# Patient Record
Sex: Female | Born: 1990 | Race: Black or African American | Hispanic: No | Marital: Married | Smoking: Never smoker
Health system: Southern US, Community
[De-identification: ages and names within clinical notes are randomized; demographics above are authoritative.]

## PROBLEM LIST (undated history)

## (undated) DIAGNOSIS — E739 Lactose intolerance, unspecified: Secondary | ICD-10-CM

## (undated) DIAGNOSIS — Z91018 Allergy to other foods: Secondary | ICD-10-CM

## (undated) DIAGNOSIS — J45909 Unspecified asthma, uncomplicated: Secondary | ICD-10-CM

## (undated) DIAGNOSIS — F419 Anxiety disorder, unspecified: Secondary | ICD-10-CM

## (undated) DIAGNOSIS — K829 Disease of gallbladder, unspecified: Secondary | ICD-10-CM

## (undated) DIAGNOSIS — F32A Depression, unspecified: Secondary | ICD-10-CM

## (undated) HISTORY — DX: Unspecified asthma, uncomplicated: J45.909

## (undated) HISTORY — DX: Allergy to other foods: Z91.018

## (undated) HISTORY — DX: Depression, unspecified: F32.A

## (undated) HISTORY — DX: Disease of gallbladder, unspecified: K82.9

## (undated) HISTORY — DX: Lactose intolerance, unspecified: E73.9

## (undated) HISTORY — PX: TUBAL LIGATION: SHX77

## (undated) HISTORY — PX: CHOLECYSTECTOMY: SHX55

## (undated) HISTORY — PX: NASAL SINUS SURGERY: SHX719

## (undated) HISTORY — PX: TONSILLECTOMY: SUR1361

## (undated) HISTORY — DX: Anxiety disorder, unspecified: F41.9

---
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
## 2012-09-20 HISTORY — PX: CHOLECYSTECTOMY: SHX55

## 2015-09-24 ENCOUNTER — Other Ambulatory Visit: Payer: Self-pay | Admitting: Sports Medicine

## 2015-09-24 MED ORDER — ALBUTEROL SULFATE HFA 108 (90 BASE) MCG/ACT IN AERS: 2.0000 | INHALATION_SPRAY | Freq: Four times a day (QID) | RESPIRATORY_TRACT | Status: DC | PRN

## 2016-10-21 HISTORY — PX: TUBAL LIGATION: SHX77

## 2016-11-04 ENCOUNTER — Encounter: Payer: Self-pay | Admitting: Sports Medicine

## 2018-01-06 DIAGNOSIS — Z131 Encounter for screening for diabetes mellitus: Secondary | ICD-10-CM | POA: Diagnosis not present

## 2018-01-06 DIAGNOSIS — E559 Vitamin D deficiency, unspecified: Secondary | ICD-10-CM | POA: Diagnosis not present

## 2018-01-06 DIAGNOSIS — Z113 Encounter for screening for infections with a predominantly sexual mode of transmission: Secondary | ICD-10-CM | POA: Diagnosis not present

## 2018-01-06 DIAGNOSIS — Z130 Encounter for screening for diseases of the blood and blood-forming organs and certain disorders involving the immune mechanism: Secondary | ICD-10-CM | POA: Diagnosis not present

## 2018-01-06 DIAGNOSIS — E049 Nontoxic goiter, unspecified: Secondary | ICD-10-CM | POA: Diagnosis not present

## 2018-01-06 DIAGNOSIS — Z13220 Encounter for screening for lipoid disorders: Secondary | ICD-10-CM | POA: Diagnosis not present

## 2018-04-11 ENCOUNTER — Encounter (HOSPITAL_COMMUNITY): Payer: Self-pay | Admitting: Nurse Practitioner

## 2018-04-11 ENCOUNTER — Emergency Department (HOSPITAL_COMMUNITY): Payer: Medicaid Other

## 2018-04-11 ENCOUNTER — Emergency Department (HOSPITAL_COMMUNITY)
Admission: EM | Admit: 2018-04-11 | Discharge: 2018-04-11 | Disposition: A | Discharge status: Home / Self Care | Payer: Medicaid Other | Attending: Emergency Medicine | Admitting: Emergency Medicine

## 2018-04-11 DIAGNOSIS — R079 Chest pain, unspecified: Secondary | ICD-10-CM | POA: Diagnosis not present

## 2018-04-11 DIAGNOSIS — R000 Tachycardia, unspecified: Secondary | ICD-10-CM | POA: Insufficient documentation

## 2018-04-11 DIAGNOSIS — R531 Weakness: Secondary | ICD-10-CM | POA: Diagnosis not present

## 2018-04-11 DIAGNOSIS — R5383 Other fatigue: Secondary | ICD-10-CM

## 2018-04-11 DIAGNOSIS — Z79899 Other long term (current) drug therapy: Secondary | ICD-10-CM | POA: Diagnosis not present

## 2018-04-11 DIAGNOSIS — R0789 Other chest pain: Secondary | ICD-10-CM | POA: Diagnosis not present

## 2018-04-11 LAB — BASIC METABOLIC PANEL
ANION GAP: 7 (ref 5–15)
BUN: 11 mg/dL (ref 6–20)
CHLORIDE: 103 mmol/L (ref 101–111)
CO2: 30 mmol/L (ref 22–32)
Calcium: 9.4 mg/dL (ref 8.9–10.3)
Creatinine, Ser: 0.93 mg/dL (ref 0.44–1.00)
GFR calc non Af Amer: >60 mL/min (ref >60)
GLUCOSE: 115 mg/dL — AB (ref 65–99)
Potassium: 3.6 mmol/L (ref 3.5–5.1)
Sodium: 140 mmol/L (ref 135–145)

## 2018-04-11 LAB — I-STAT BETA HCG BLOOD, ED (MC, WL, AP ONLY): I-stat hCG, quantitative: <5 m[IU]/mL (ref <5)

## 2018-04-11 LAB — CBC WITH DIFFERENTIAL/PLATELET
BASOS ABS: 0.1 10*3/uL (ref 0.0–0.1)
Basophils Relative: 1 %
Eosinophils Absolute: 0.1 10*3/uL (ref 0.0–0.7)
Eosinophils Relative: 1 %
HEMATOCRIT: 41 % (ref 36.0–46.0)
Hemoglobin: 13.9 g/dL (ref 12.0–15.0)
Lymphocytes Relative: 28 %
Lymphs Abs: 2.9 10*3/uL (ref 0.7–4.0)
MCH: 29.1 pg (ref 26.0–34.0)
MCHC: 33.9 g/dL (ref 30.0–36.0)
MCV: 86 fL (ref 78.0–100.0)
MONO ABS: 0.6 10*3/uL (ref 0.1–1.0)
Monocytes Relative: 6 %
Neutro Abs: 6.6 10*3/uL (ref 1.7–7.7)
Neutrophils Relative %: 64 %
Platelets: 359 10*3/uL (ref 150–400)
RBC: 4.77 MIL/uL (ref 3.87–5.11)
RDW: 13 % (ref 11.5–15.5)
WBC: 10.3 10*3/uL (ref 4.0–10.5)

## 2018-04-11 LAB — I-STAT TROPONIN, ED: Troponin i, poc: 0 ng/mL (ref 0.00–0.08)

## 2018-04-11 LAB — TSH: TSH: 1.782 u[IU]/mL (ref 0.350–4.500)

## 2018-04-11 MED ORDER — IOPAMIDOL (ISOVUE-370) INJECTION 76%
INTRAVENOUS | Status: AC
  Filled 2018-04-11: qty 100

## 2018-04-11 MED ORDER — SODIUM CHLORIDE 0.9 % IV BOLUS
1000.0000 mL | Freq: Once | INTRAVENOUS | Status: AC
  Administered 2018-04-11: 1000 mL via INTRAVENOUS

## 2018-04-11 MED ORDER — IOPAMIDOL (ISOVUE-370) INJECTION 76%
100.0000 mL | Freq: Once | INTRAVENOUS | Status: AC | PRN
  Administered 2018-04-11: 100 mL via INTRAVENOUS

## 2018-04-11 MED ORDER — NAPROXEN 500 MG PO TABS: 500.0000 mg | ORAL_TABLET | Freq: Two times a day (BID) | ORAL | 0 refills | Status: DC

## 2018-04-11 NOTE — ED Triage Notes (Signed)
Pt states since her flight back from [place] about 2 days ago, she has been experiencing generalized weakness and fatigue. Today she started experiencing an right sided achy chest pain that radiates to her right shoulder. She denies shortness of breath, or any cardiopulmonary hx.

## 2018-04-11 NOTE — ED Provider Notes (Signed)
[facility]-EMERGENCY DEPT Provider Note   CSN: [id number] Arrival date & time: 04/11/18  1953     History   Chief Complaint Chief Complaint  Patient presents with  . Chest Pain  . Fatigue    HPI Tamara Weaver is a 27 y.o. female.  Chief complaint is fatigue.  27 year old female.  Previously healthy.  Went to [place] last Tuesday.  Return Friday.  Since returning she has felt fatigued.  No fever.  No chills.  No illness.  Not nauseated or vomiting.  No reasons to be dehydrated.  She has some generalized discomfort.  She states she does have some discomfort in her chest.  She does not feel short of breath.  Does not feel palpitations is not weak to the point of near syncope or being lightheaded.  She denies pregnancy.  HPIsee above.   History reviewed. No pertinent past medical history.  There are no active problems to display for this patient.   Past Surgical History:  Procedure Laterality Date  . CHOLECYSTECTOMY    . TONSILLECTOMY    . TUBAL LIGATION       OB History   None      Home Medications    Prior to Admission medications   Medication Sig Start Date End Date Taking? Authorizing Provider  Multiple Vitamins-Calcium (ONE-A-DAY WOMENS PO) Take 1 tablet by mouth daily.   Yes [provider]  albuterol (PROVENTIL HFA;VENTOLIN HFA) 108 (90 BASE) MCG/ACT inhaler Inhale 2 puffs into the lungs every 6 (six) hours as needed for wheezing or shortness of breath. Patient not taking: Reported on 04/11/2018 09/24/15   Asencion Islam, DPM    Family History No family history on file.  Social History Social History   Tobacco Use  . Smoking status: Never Smoker  . Smokeless tobacco: Never Used  Substance Use Topics  . Alcohol use: Yes  . Drug use: Not Currently     Allergies   Patient has no known allergies.   Review of Systems Review of Systems  Constitutional: Positive for fatigue. Negative for appetite change, chills, diaphoresis  and fever.  HENT: Negative for mouth sores, sore throat and trouble swallowing.   Eyes: Negative for visual disturbance.  Respiratory: Negative for cough, chest tightness, shortness of breath and wheezing.   Cardiovascular: Positive for chest pain.  Gastrointestinal: Negative for abdominal distention, abdominal pain, diarrhea, nausea and vomiting.  Endocrine: Negative for polydipsia, polyphagia and polyuria.  Genitourinary: Negative for dysuria, frequency and hematuria.  Musculoskeletal: Negative for gait problem.  Skin: Negative for color change, pallor and rash.  Neurological: Positive for weakness. Negative for dizziness, syncope, light-headedness and headaches.  Hematological: Does not bruise/bleed easily.  Psychiatric/Behavioral: Negative for behavioral problems and confusion.     Physical Exam Updated Vital Signs BP 120/81   Pulse (!) 106   Temp 98.4 F (36.9 C) (Oral)   Resp 15   Ht 5\' 1"  (1.549 m)   Wt 68 kg (150 lb)   LMP  (LMP Unknown) Comment: currently on period  SpO2 98%   BMI 28.34 kg/m   Physical Exam  Constitutional: She is oriented to person, place, and time. She appears well-developed and well-nourished. No distress.  HENT:  Head: Normocephalic.  Eyes: Pupils are equal, round, and reactive to light. Conjunctivae are normal. No scleral icterus.  Neck: Normal range of motion. Neck supple. No thyromegaly present.  Cardiovascular: Normal rate and regular rhythm. Exam reveals no gallop and no friction rub.  No  murmur heard. Is tachycardia.  117  Pulmonary/Chest: Effort normal and breath sounds normal. No respiratory distress. She has no wheezes. She has no rales.  Abdominal: Soft. Bowel sounds are normal. She exhibits no distension. There is no tenderness. There is no rebound.  Musculoskeletal: Normal range of motion.  Neurological: She is alert and oriented to person, place, and time.  Skin: Skin is warm and dry. No rash noted.  Psychiatric: She has a normal  mood and affect. Her behavior is normal.     ED Treatments / Results  Labs (all labs ordered are listed, but only abnormal results are displayed) Labs Reviewed  BASIC METABOLIC PANEL - Abnormal; Notable for the following components:      Result Value   Glucose, Bld 115 (*)    All other components within normal limits  CBC WITH DIFFERENTIAL/PLATELET  TSH  I-STAT TROPONIN, ED  I-STAT BETA HCG BLOOD, ED ([facility], [facility], [facility] ONLY)    EKG None  Radiology Ct Angio Chest Pe W And/or Wo Contrast  Result Date: 04/11/2018 CLINICAL DATA:  Chest pain right-sided EXAM: CT ANGIOGRAPHY CHEST WITH CONTRAST TECHNIQUE: Multidetector CT imaging of the chest was performed using the standard protocol during bolus administration of intravenous contrast. Multiplanar CT image reconstructions and MIPs were obtained to evaluate the vascular anatomy. CONTRAST:  ISOVUE-370 IOPAMIDOL (ISOVUE-370) INJECTION 76% COMPARISON:  None. FINDINGS: Cardiovascular: Satisfactory opacification of the pulmonary arteries to the segmental level. No evidence of pulmonary embolism. Normal heart size. No pericardial effusion. Nonaneurysmal aorta. No dissection. Mediastinum/Nodes: No enlarged mediastinal, hilar, or axillary lymph nodes. Thyroid gland, trachea, and esophagus demonstrate no significant findings. Lungs/Pleura: Lungs are clear. No pleural effusion or pneumothorax. Upper Abdomen: No acute abnormality. Musculoskeletal: No chest wall abnormality. No acute or significant osseous findings. Review of the MIP images confirms the above findings. IMPRESSION: Negative. No CT evidence for acute pulmonary embolus or aortic dissection. Clear lung fields. Electronically Signed   By: Jasmine Pang M.D.   On: 04/11/2018 22:19   EKG Interpretation  Date/Time:  Saturday April 11 2018 20:17:41 EDT Ventricular Rate:  124 PR Interval:    QRS Duration: 83 QT Interval:  299 QTC Calculation: 430 R Axis:   85 Text Interpretation:  Sinus  tachycardia Borderline Q waves in lateral leads Borderline repolarization abnormality Confirmed by Rolland Porter ([id number]) on 04/11/2018 10:34:14 PM   Procedures Procedures (including critical care time)  Medications Ordered in ED Medications  iopamidol (ISOVUE-370) 76 % injection (has no administration in time range)  sodium chloride 0.9 % bolus 1,000 mL (1,000 mLs Intravenous New Bag/Given 04/11/18 2100)  iopamidol (ISOVUE-370) 76 % injection 100 mL (100 mLs Intravenous Contrast Given 04/11/18 2154)     Initial Impression / Assessment and Plan / ED Course  I have reviewed the triage vital signs and the nursing notes.  Pertinent labs & imaging results that were available during my care of the patient were reviewed by me and considered in my medical decision making (see chart for details).   Heart rate improves with fluids.  Recheck 98.  EKG shows sinus tachycardia.  Does not show acute or ischemic changes.  No injury or ectopy.  Does not have ST elevations that would suggest pericarditis.  She does not have murmur gallop on exam.  Reassuring CT angios.  TSH pending.  Plan is home.  Anti-inflammatories.  Push fluids.  Recheck any worsening.  Primary care follow-up.   Final Clinical Impressions(s) / ED Diagnoses   Final diagnoses:  Fatigue, unspecified type  Tachycardia    ED Discharge Orders    None       Rolland PorterJames, Magdala Brahmbhatt, MD 04/11/18 2238

## 2018-04-11 NOTE — Discharge Instructions (Addendum)
Push fluids to stay hydrated. Return here as needed with any new or worsening symptoms. Recommend primary care follow-up. Naproxen,  anti-inflammatory, twice per day for your chest discomfort.

## 2018-09-18 DIAGNOSIS — B308 Other viral conjunctivitis: Secondary | ICD-10-CM | POA: Diagnosis not present

## 2018-09-22 DIAGNOSIS — J020 Streptococcal pharyngitis: Secondary | ICD-10-CM | POA: Diagnosis not present

## 2018-10-18 DIAGNOSIS — J029 Acute pharyngitis, unspecified: Secondary | ICD-10-CM | POA: Diagnosis not present

## 2018-12-23 DIAGNOSIS — H5213 Myopia, bilateral: Secondary | ICD-10-CM | POA: Diagnosis not present

## 2018-12-24 DIAGNOSIS — H5213 Myopia, bilateral: Secondary | ICD-10-CM | POA: Diagnosis not present

## 2019-01-05 DIAGNOSIS — H5213 Myopia, bilateral: Secondary | ICD-10-CM | POA: Diagnosis not present

## 2019-04-07 ENCOUNTER — Other Ambulatory Visit: Payer: Self-pay | Admitting: Sports Medicine

## 2019-04-07 MED ORDER — FLUCONAZOLE 150 MG PO TABS: 150.0000 mg | ORAL_TABLET | Freq: Once | ORAL | 0 refills | Status: AC

## 2019-04-07 MED ORDER — FLUCONAZOLE 150 MG PO TABS: 150.0000 mg | ORAL_TABLET | Freq: Once | ORAL | 0 refills | Status: DC

## 2019-04-07 MED ORDER — AMOXICILLIN-POT CLAVULANATE 875-125 MG PO TABS: 1.0000 | ORAL_TABLET | Freq: Two times a day (BID) | ORAL | 0 refills | Status: DC

## 2019-04-07 NOTE — Progress Notes (Signed)
Meds sent

## 2019-09-23 DIAGNOSIS — Z20828 Contact with and (suspected) exposure to other viral communicable diseases: Secondary | ICD-10-CM | POA: Diagnosis not present

## 2019-10-28 ENCOUNTER — Other Ambulatory Visit: Payer: Self-pay

## 2019-10-28 ENCOUNTER — Encounter (HOSPITAL_COMMUNITY): Payer: Self-pay

## 2019-10-28 ENCOUNTER — Emergency Department (HOSPITAL_COMMUNITY)
Admission: EM | Admit: 2019-10-28 | Discharge: 2019-10-28 | Disposition: A | Discharge status: Home / Self Care | Payer: Medicaid Other | Attending: Emergency Medicine | Admitting: Emergency Medicine

## 2019-10-28 DIAGNOSIS — I889 Nonspecific lymphadenitis, unspecified: Secondary | ICD-10-CM | POA: Diagnosis not present

## 2019-10-28 DIAGNOSIS — J029 Acute pharyngitis, unspecified: Secondary | ICD-10-CM | POA: Diagnosis present

## 2019-10-28 DIAGNOSIS — R070 Pain in throat: Secondary | ICD-10-CM | POA: Diagnosis not present

## 2019-10-28 DIAGNOSIS — Z79899 Other long term (current) drug therapy: Secondary | ICD-10-CM | POA: Insufficient documentation

## 2019-10-28 MED ORDER — NAPROXEN 500 MG PO TABS: 500.0000 mg | ORAL_TABLET | Freq: Two times a day (BID) | ORAL | 0 refills | Status: DC | PRN

## 2019-10-28 NOTE — ED Notes (Signed)
An After Visit Summary was printed and given to the patient. Discharge instructions given and no further questions at this time.  

## 2019-10-28 NOTE — ED Provider Notes (Signed)
[facility]-EMERGENCY DEPT Provider Note   CSN: [id number] Arrival date & time: 10/28/19  1513     History Chief Complaint  Patient presents with  . Sore Throat    Tamara Weaver is a 29 y.o. female with no significant past medical history presents to the ED with sore throat x10 days with mild swelling and tenderness underneath the left side of her chin.  Patient reports that she went to urgent care today and was prescribed Augmentin given suspicion for bacterial etiology of her sore throat symptoms.  Patient has only been taking Tylenol for her discomfort today, but no other medications or conservative therapies.  She denies any fevers or chills, headache or dizziness, rhinorrhea or congestion, chest pain or difficulty breathing, cough, abdominal discomfort, nausea or vomiting, fatigue, changes in bowel habits, loss of smell or taste, or any other symptoms.  She also denies any trismus, drooling, voice change, wheezing, or difficulty swallowing.  HPI     History reviewed. No pertinent past medical history.  There are no problems to display for this patient.   Past Surgical History:  Procedure Laterality Date  . CHOLECYSTECTOMY    . NASAL SINUS SURGERY    . TONSILLECTOMY    . TUBAL LIGATION       OB History   No obstetric history on file.     Family History  Problem Relation Age of Onset  . Asthma Mother     Social History   Tobacco Use  . Smoking status: Never Smoker  . Smokeless tobacco: Never Used  Substance Use Topics  . Alcohol use: Never  . Drug use: Never    Home Medications Prior to Admission medications   Medication Sig Start Date End Date Taking? Authorizing Provider  albuterol (PROVENTIL HFA;VENTOLIN HFA) 108 (90 BASE) MCG/ACT inhaler Inhale 2 puffs into the lungs every 6 (six) hours as needed for wheezing or shortness of breath. Patient not taking: Reported on 04/11/2018 09/24/15   Asencion Islam, DPM  amoxicillin-clavulanate  (AUGMENTIN) 875-125 MG tablet Take 1 tablet by mouth 2 (two) times daily. 04/07/19   Asencion Islam, DPM  Multiple Vitamins-Calcium (ONE-A-DAY WOMENS PO) Take 1 tablet by mouth daily.    [provider]  naproxen (NAPROSYN) 500 MG tablet Take 1 tablet (500 mg total) by mouth 2 (two) times daily between meals as needed for moderate pain. 10/28/19   Lorelee New, PA-C    Allergies    Patient has no known allergies.  Review of Systems   Review of Systems  Constitutional: Negative for chills and fever.  HENT: Positive for sore throat. Negative for drooling, trouble swallowing and voice change.   Neurological: Negative for headaches.    Physical Exam Updated Vital Signs BP (!) 146/103 (BP Location: Right Arm)   Pulse (!) 117   Temp 98.2 F (36.8 C)   Resp 16   Ht 5\' 1"  (1.549 m)   Wt 77.1 kg   LMP 10/12/2019 (Approximate)   SpO2 100%   BMI 32.12 kg/m   Physical Exam Vitals and nursing note reviewed. Exam conducted with a chaperone present.  Constitutional:      General: She is not in acute distress.    Appearance: Normal appearance. She is not ill-appearing.  HENT:     Head: Normocephalic and atraumatic.     Nose: Nose normal.     Mouth/Throat:     Comments: Patent oropharynx.  No tonsillar hypertrophy or exudates noted.  No stones noted.  Mildly erythematous.  No uvular deviation or masses appreciated.  No trismus.  Tolerating secretions.  No tongue swelling or soft palate induration.  Good dentition throughout. Eyes:     General: No scleral icterus.    Conjunctiva/sclera: Conjunctivae normal.  Neck:     Comments: Full ROM.  Small firm mass appreciated in left submandibular area suspicious for a sublingual/submandibular lymphadenitis.  Mildly tender to palpation.  Clearly delineated with no surrounding swelling. No overlying skin changes.  Cardiovascular:     Rate and Rhythm: Regular rhythm. Tachycardia present.     Pulses: Normal pulses.     Heart sounds: Normal  heart sounds.  Pulmonary:     Effort: Pulmonary effort is normal. No respiratory distress.     Breath sounds: Normal breath sounds. No wheezing.  Skin:    General: Skin is dry.  Neurological:     Mental Status: She is alert.     GCS: GCS eye subscore is 4. GCS verbal subscore is 5. GCS motor subscore is 6.  Psychiatric:        Mood and Affect: Mood normal.        Behavior: Behavior normal.        Thought Content: Thought content normal.      ED Results / Procedures / Treatments   Labs (all labs ordered are listed, but only abnormal results are displayed) Labs Reviewed - No data to display  EKG None  Radiology No results found.  Procedures Procedures (including critical care time)  Medications Ordered in ED Medications - No data to display  ED Course  I have reviewed the triage vital signs and the nursing notes.  Pertinent labs & imaging results that were available during my care of the patient were reviewed by me and considered in my medical decision making (see chart for details).    MDM Rules/Calculators/A&P                      Patient is a young, otherwise healthy female who does not take medications regularly.  While she is tachycardic to 117, she reports that she has been eating and drinking well and "always has elevated heart rate when around doctors".  She reports that she is purely nervous and denies any fevers or chills.  Low suspicion for significant infection.  Instead, her history and physical exam suggest a localized submandibular lymphadenitis vs sialoadenitis.  She denies any trismus, difficulty eating or drinking, feeling as though something gets stuck in her throat, wheezing or respiratory difficulty, drooling, voice change, or other concerning symptoms.  No uvular deviation or soft palate induration.  Do not suspect PTA, RPA, or other soft tissue abscess.  Her sore throat has been manageable with Tylenol, but will prescribe naproxen.  She denies any  chance of pregnancy or history of peptic ulcer disease.  Provided reassurance to patient.  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   Final Clinical Impression(s) / ED Diagnoses Final diagnoses:  Lymphadenitis    Rx / DC Orders ED Discharge Orders         Ordered    naproxen (NAPROSYN) 500 MG tablet  2 times daily between meals PRN     10/28/19 1619           Corena Herter, PA-C 10/28/19 1620    Milton Ferguson, MD 10/28/19 2325

## 2019-10-28 NOTE — Discharge Instructions (Addendum)
Please read the attachment on lymphadenopathy.  Please take the naproxen, as prescribed.  Do not combine with other NSAIDs.  Discontinue if your pregnancy status changes.  I also recommend warm tea with honey or throat lozenges for your sore throat symptoms.  Continue taking your Augmentin, as prescribed.  Return to the ED or seek medical attention for any new or worsening symptoms.

## 2019-10-28 NOTE — ED Triage Notes (Addendum)
Patient c/o sore throat on the left x 1 1/2 weeks. Patient denies any fever or difficulty swallowing or breathing.  Patient went to an UC today and was given a RX for Augmentin and started that today.

## 2019-11-02 DIAGNOSIS — K219 Gastro-esophageal reflux disease without esophagitis: Secondary | ICD-10-CM | POA: Insufficient documentation

## 2019-11-02 DIAGNOSIS — J358 Other chronic diseases of tonsils and adenoids: Secondary | ICD-10-CM | POA: Insufficient documentation

## 2019-11-02 DIAGNOSIS — T17208A Unspecified foreign body in pharynx causing other injury, initial encounter: Secondary | ICD-10-CM | POA: Diagnosis not present

## 2019-11-02 DIAGNOSIS — R070 Pain in throat: Secondary | ICD-10-CM | POA: Diagnosis not present

## 2019-11-08 ENCOUNTER — Other Ambulatory Visit: Payer: Self-pay | Admitting: Sports Medicine

## 2019-11-08 MED ORDER — FLUCONAZOLE 150 MG PO TABS: 150.0000 mg | ORAL_TABLET | Freq: Once | ORAL | 0 refills | Status: AC

## 2019-11-08 NOTE — Progress Notes (Signed)
Diflucan sent to pharmacy.

## 2020-03-06 DIAGNOSIS — Z23 Encounter for immunization: Secondary | ICD-10-CM | POA: Diagnosis not present

## 2020-03-27 ENCOUNTER — Emergency Department (HOSPITAL_COMMUNITY)
Admission: EM | Admit: 2020-03-27 | Discharge: 2020-03-27 | Discharge status: Against Medical Advice | Payer: Medicaid Other | Attending: Emergency Medicine | Admitting: Emergency Medicine

## 2020-03-27 ENCOUNTER — Other Ambulatory Visit: Payer: Self-pay

## 2020-03-27 ENCOUNTER — Encounter (HOSPITAL_COMMUNITY): Payer: Self-pay

## 2020-03-27 ENCOUNTER — Emergency Department (HOSPITAL_COMMUNITY): Payer: Medicaid Other

## 2020-03-27 DIAGNOSIS — Z79899 Other long term (current) drug therapy: Secondary | ICD-10-CM | POA: Insufficient documentation

## 2020-03-27 DIAGNOSIS — R0602 Shortness of breath: Secondary | ICD-10-CM | POA: Diagnosis not present

## 2020-03-27 DIAGNOSIS — R0789 Other chest pain: Secondary | ICD-10-CM | POA: Insufficient documentation

## 2020-03-27 DIAGNOSIS — R079 Chest pain, unspecified: Secondary | ICD-10-CM

## 2020-03-27 DIAGNOSIS — J984 Other disorders of lung: Secondary | ICD-10-CM | POA: Diagnosis not present

## 2020-03-27 MED ORDER — SODIUM CHLORIDE 0.9% FLUSH: 3.0000 mL | Freq: Once | INTRAVENOUS | Status: DC

## 2020-03-27 NOTE — ED Notes (Signed)
Called pt for rooming No answer x2

## 2020-03-27 NOTE — Discharge Instructions (Addendum)
You have chosen to sign out AMA.  I cannot guarantee there is not a cardiac emergency without blood work. I do recommend that you at least follow-up with your primary care doctor.

## 2020-03-27 NOTE — ED Triage Notes (Signed)
Patient c/o intermittent mid chest pain that radiates into the left shoulder since yesterday. Patient denies any SOB or nausea.

## 2020-03-27 NOTE — ED Notes (Signed)
Pt states she needs [place] IV for labs

## 2020-03-27 NOTE — ED Provider Notes (Signed)
[facility]-EMERGENCY DEPT Provider Note   CSN: [id number] Arrival date & time: 03/27/20  1816     History Chief Complaint  Patient presents with  . Chest Pain    Tamara Weaver is a 29 y.o. female.  The history is provided by the patient and medical records.  Chest Pain  29 year old female with no significant past medical history presenting to the ED with chest pain.  States this began yesterday, localized to right upper chest and into her right shoulder.  States it was intermittent yesterday but has been fairly constant today.  She denies any current shortness of breath.  States she did have a panic attack yesterday morning when symptoms began that caused her to be a little short of breath but symptoms have resolved.  She has not had any cough or fever.  No prior cardiac history.  She is not a smoker.  Does not take OCPs or other exogenous estrogen use.  No recent travel or prolonged immobilization.  No recent surgeries.  She has not tried any medications for her symptoms prior to arrival.  History reviewed. No pertinent past medical history.  There are no problems to display for this patient.   Past Surgical History:  Procedure Laterality Date  . CHOLECYSTECTOMY    . NASAL SINUS SURGERY    . TUBAL LIGATION       OB History   No obstetric history on file.     Family History  Problem Relation Age of Onset  . Asthma Mother     Social History   Tobacco Use  . Smoking status: Never Smoker  . Smokeless tobacco: Never Used  Substance Use Topics  . Alcohol use: Never  . Drug use: Never    Home Medications Prior to Admission medications   Medication Sig Start Date End Date Taking? Authorizing Provider  albuterol (PROVENTIL HFA;VENTOLIN HFA) 108 (90 BASE) MCG/ACT inhaler Inhale 2 puffs into the lungs every 6 (six) hours as needed for wheezing or shortness of breath. Patient not taking: Reported on 04/11/2018 09/24/15   Asencion Islam, DPM    amoxicillin-clavulanate (AUGMENTIN) 875-125 MG tablet Take 1 tablet by mouth 2 (two) times daily. 04/07/19   Asencion Islam, DPM  Multiple Vitamins-Calcium (ONE-A-DAY WOMENS PO) Take 1 tablet by mouth daily.    [provider]  naproxen (NAPROSYN) 500 MG tablet Take 1 tablet (500 mg total) by mouth 2 (two) times daily between meals as needed for moderate pain. 10/28/19   Lorelee New, PA-C    Allergies    Other  Review of Systems   Review of Systems  Cardiovascular: Positive for chest pain.  All other systems reviewed and are negative.   Physical Exam Updated Vital Signs BP (!) 129/100 (BP Location: Right Arm)   Pulse 90   Temp 99.3 F (37.4 C) (Oral)   Resp 18   Ht 5\' 1"  (1.549 m)   Wt 74.8 kg   LMP 03/04/2020   SpO2 100%   BMI 31.18 kg/m   Physical Exam Vitals and nursing note reviewed.  Constitutional:      Appearance: She is well-developed.  HENT:     Head: Normocephalic and atraumatic.  Eyes:     Conjunctiva/sclera: Conjunctivae normal.     Pupils: Pupils are equal, round, and reactive to light.  Cardiovascular:     Rate and Rhythm: Normal rate and regular rhythm.     Heart sounds: Normal heart sounds.     Comments: NSR, HR  stable at 87 during exam Pulmonary:     Effort: Pulmonary effort is normal.     Breath sounds: Normal breath sounds. No wheezing or rhonchi.  Chest:     Comments: Chest wall is nontender Abdominal:     General: Bowel sounds are normal.     Palpations: Abdomen is soft.  Musculoskeletal:        General: Normal range of motion.     Cervical back: Normal range of motion.  Skin:    General: Skin is warm and dry.  Neurological:     Mental Status: She is alert and oriented to person, place, and time.     ED Results / Procedures / Treatments   Labs (all labs ordered are listed, but only abnormal results are displayed) Labs Reviewed  BASIC METABOLIC PANEL  CBC  I-STAT BETA HCG BLOOD, ED ([facility], [facility], [facility] ONLY)  TROPONIN I (HIGH  SENSITIVITY)  TROPONIN I (HIGH SENSITIVITY)    EKG None  Radiology DG Chest 2 View  Result Date: 03/27/2020 CLINICAL DATA:  Chest pain, short of breath EXAM: CHEST - 2 VIEW COMPARISON:  None. FINDINGS: The heart size and mediastinal contours are within normal limits. Both lungs are clear. The visualized skeletal structures are unremarkable. IMPRESSION: No active cardiopulmonary disease. Electronically Signed   By: Randa Ngo M.D.   On: 03/27/2020 19:28    Procedures Procedures (including critical care time)  Medications Ordered in ED Medications  sodium chloride flush (NS) 0.9 % injection 3 mL (has no administration in time range)    ED Course  I have reviewed the triage vital signs and the nursing notes.  Pertinent labs & imaging results that were available during my care of the patient were reviewed by me and considered in my medical decision making (see chart for details).    MDM Rules/Calculators/A&P  29 year old female presents to the ED with right-sided chest pain.  Symptoms began yesterday, initially intermittent but more constant today.  No cough, fever, or other infectious symptoms.  She is afebrile nontoxic in appearance here.  Chest is nontender.  Her lungs are clear without any noted wheezes or rhonchi.  Had a initial tachycardia on arrival of 103, however returned to the 80s by time of my evaluation without intervention.  Denies any pleuritic type pain with deep breathing, OCP use, exogenous estrogen, or long distance travel.  No history of DVT or PE.  No cardiac history.  She is not a smoker.  Work-up is pending.  11:18 PM Notified by RN that patient wanted to leave AMA.  She has had CXR and EKG but no labs.  She is aware that we cannot guarantee there is not a cardiac condition or other emergency without further work-up.  This may results in end organ damage, disability, or death.  Patient acknowledges this and will sign out AMA.  Final Clinical Impression(s) / ED  Diagnoses Final diagnoses:  Chest pain in adult    Rx / DC Orders ED Discharge Orders    None       Larene Pickett, PA-C 03/27/20 2347    Drenda Freeze, MD 03/27/20 [phone]

## 2020-04-27 ENCOUNTER — Ambulatory Visit: Payer: Medicaid Other | Admitting: Nurse Practitioner

## 2020-05-24 ENCOUNTER — Ambulatory Visit: Payer: Medicaid Other | Admitting: Nurse Practitioner

## 2020-05-30 ENCOUNTER — Other Ambulatory Visit: Payer: Self-pay | Admitting: Sports Medicine

## 2020-05-30 MED ORDER — AZITHROMYCIN 250 MG PO TABS: ORAL_TABLET | ORAL | 0 refills | Status: DC

## 2020-05-30 NOTE — Progress Notes (Signed)
Sent Zpak

## 2020-05-31 DIAGNOSIS — Z20822 Contact with and (suspected) exposure to covid-19: Secondary | ICD-10-CM | POA: Diagnosis not present

## 2020-06-07 ENCOUNTER — Other Ambulatory Visit: Payer: Self-pay

## 2020-06-08 ENCOUNTER — Encounter: Payer: Self-pay | Admitting: Nurse Practitioner

## 2020-06-08 ENCOUNTER — Ambulatory Visit (INDEPENDENT_AMBULATORY_CARE_PROVIDER_SITE_OTHER): Payer: Medicaid Other | Admitting: Nurse Practitioner

## 2020-06-08 VITALS — BP 130/86 | HR 94 | Temp 97.5°F | Ht 61.0 in | Wt 177.8 lb

## 2020-06-08 DIAGNOSIS — Z13220 Encounter for screening for lipoid disorders: Secondary | ICD-10-CM

## 2020-06-08 DIAGNOSIS — E6609 Other obesity due to excess calories: Secondary | ICD-10-CM | POA: Diagnosis not present

## 2020-06-08 DIAGNOSIS — E669 Obesity, unspecified: Secondary | ICD-10-CM | POA: Insufficient documentation

## 2020-06-08 DIAGNOSIS — Z6833 Body mass index (BMI) 33.0-33.9, adult: Secondary | ICD-10-CM | POA: Diagnosis not present

## 2020-06-08 DIAGNOSIS — Z136 Encounter for screening for cardiovascular disorders: Secondary | ICD-10-CM | POA: Diagnosis not present

## 2020-06-08 DIAGNOSIS — F411 Generalized anxiety disorder: Secondary | ICD-10-CM

## 2020-06-08 LAB — BASIC METABOLIC PANEL
BUN: 11 mg/dL (ref 6–23)
CO2: 26 mEq/L (ref 19–32)
Calcium: 9.4 mg/dL (ref 8.4–10.5)
Chloride: 103 mEq/L (ref 96–112)
Creatinine, Ser: 0.86 mg/dL (ref 0.40–1.20)
GFR: 95.26 mL/min (ref >60.00)
Glucose, Bld: 93 mg/dL (ref 70–99)
Potassium: 4.3 mEq/L (ref 3.5–5.1)
Sodium: 136 mEq/L (ref 135–145)

## 2020-06-08 LAB — LIPID PANEL
Cholesterol: 219 mg/dL — ABNORMAL HIGH (ref 0–200)
HDL: 53.2 mg/dL (ref >39.00)
LDL Cholesterol: 146 mg/dL — ABNORMAL HIGH (ref 0–99)
NonHDL: 166.04
Total CHOL/HDL Ratio: 4
Triglycerides: 101 mg/dL (ref 0.0–149.0)
VLDL: 20.2 mg/dL (ref 0.0–40.0)

## 2020-06-08 LAB — HEMOGLOBIN A1C: Hgb A1c MFr Bld: 5.6 % (ref 4.6–6.5)

## 2020-06-08 LAB — TSH: TSH: 1.27 u[IU]/mL (ref 0.35–4.50)

## 2020-06-08 MED ORDER — DULOXETINE HCL 20 MG PO CPEP: 20.0000 mg | ORAL_CAPSULE | Freq: Every day | ORAL | 2 refills | Status: DC

## 2020-06-08 NOTE — Assessment & Plan Note (Signed)
Difficulty with weight loss despite decreased portion, heart health diet, and exercise 3x/week. Admits with emotional eating. Check BMP and TSH Consider referral to weight loss clinic

## 2020-06-08 NOTE — Assessment & Plan Note (Signed)
Chronic, waxing and waning Preoccupation with death, but no SI No hx of hospitalization, no ETOH use, no tobacco use, no illicit drug use. No Hx of suicide PHQ-9 13/27 and GAD-7 13/21  Check TSH, CBC, BMP Start cymbalta Entered referral to therapist Avoid SSRI due to potential weight gain as side effect F/up in 30month

## 2020-06-08 NOTE — Patient Instructions (Addendum)
Thank you for choosing [facility] Go to lab for blood draw. Start cymbalta. Take with supper. You will be contacted to schedule appt with therapist.   Duloxetine delayed-release capsules What is this medicine? DULOXETINE (doo LOX e teen) is used to treat depression, anxiety, and different types of chronic pain. This medicine may be used for other purposes; ask your health care provider or pharmacist if you have questions. COMMON BRAND NAME(S): Cymbalta, Murrell Converse, Irenka What should I tell my health care provider before I take this medicine? They need to know if you have any of these conditions:  bipolar disorder  glaucoma  high blood pressure  kidney disease  liver disease  seizures  suicidal thoughts, plans or attempt; a previous suicide attempt by you or a family member  take medicines that treat or prevent blood clots  taken medicines called MAOIs like Carbex, Eldepryl, Marplan, Nardil, and Parnate within 14 days  trouble passing urine  an unusual reaction to duloxetine, other medicines, foods, dyes, or preservatives  pregnant or trying to get pregnant  breast-feeding How should I use this medicine? Take this medicine by mouth with a glass of water. Follow the directions on the prescription label. Do not crush, cut or chew some capsules of this medicine. Some capsules may be opened and sprinkled on applesauce. Check with your doctor or pharmacist if you are not sure. You can take this medicine with or without food. Take your medicine at regular intervals. Do not take your medicine more often than directed. Do not stop taking this medicine suddenly except upon the advice of your doctor. Stopping this medicine too quickly may cause serious side effects or your condition may worsen. A special MedGuide will be given to you by the pharmacist with each prescription and refill. Be sure to read this information carefully each time. Talk to your pediatrician regarding  the use of this medicine in children. While this drug may be prescribed for children as young as 51 years of age for selected conditions, precautions do apply. Overdosage: If you think you have taken too much of this medicine contact a poison control center or emergency room at once. NOTE: This medicine is only for you. Do not share this medicine with others. What if I miss a dose? If you miss a dose, take it as soon as you can. If it is almost time for your next dose, take only that dose. Do not take double or extra doses. What may interact with this medicine? Do not take this medicine with any of the following medications:  desvenlafaxine  levomilnacipran  linezolid  MAOIs like Carbex, Eldepryl, Marplan, Nardil, and Parnate  methylene blue (injected into a vein)  milnacipran  thioridazine  venlafaxine This medicine may also interact with the following medications:  alcohol  amphetamines  aspirin and aspirin-like medicines  certain antibiotics like ciprofloxacin and enoxacin  certain medicines for blood pressure, heart disease, irregular heart beat  certain medicines for depression, anxiety, or psychotic disturbances  certain medicines for migraine headache like almotriptan, eletriptan, frovatriptan, naratriptan, rizatriptan, sumatriptan, zolmitriptan  certain medicines that treat or prevent blood clots like warfarin, enoxaparin, and dalteparin  cimetidine  fentanyl  lithium  NSAIDS, medicines for pain and inflammation, like ibuprofen or naproxen  phentermine  procarbazine  rasagiline  sibutramine  St. John's wort  theophylline  tramadol  tryptophan This list may not describe all possible interactions. Give your health care provider a list of all the medicines, herbs, non-prescription drugs, or  dietary supplements you use. Also tell them if you smoke, drink alcohol, or use illegal drugs. Some items may interact with your medicine. What should I watch  for while using this medicine? Tell your doctor if your symptoms do not get better or if they get worse. Visit your doctor or healthcare provider for regular checks on your progress. Because it may take several weeks to see the full effects of this medicine, it is important to continue your treatment as prescribed by your doctor. This medicine may cause serious skin reactions. They can happen weeks to months after starting the medicine. Contact your healthcare provider right away if you notice fevers or flu-like symptoms with a rash. The rash may be red or purple and then turn into blisters or peeling of the skin. Or, you might notice a red rash with swelling of the face, lips, or lymph nodes in your neck or under your arms. Patients and their families should watch out for new or worsening thoughts of suicide or depression. Also watch out for sudden changes in feelings such as feeling anxious, agitated, panicky, irritable, hostile, aggressive, impulsive, severely restless, overly excited and hyperactive, or not being able to sleep. If this happens, especially at the beginning of treatment or after a change in dose, call your healthcare provider. You may get drowsy or dizzy. Do not drive, use machinery, or do anything that needs mental alertness until you know how this medicine affects you. Do not stand or sit up quickly, especially if you are an older patient. This reduces the risk of dizzy or fainting spells. Alcohol may interfere with the effect of this medicine. Avoid alcoholic drinks. This medicine can cause an increase in blood pressure. This medicine can also cause a sudden drop in your blood pressure, which may make you feel faint and increase the chance of a fall. These effects are most common when you first start the medicine or when the dose is increased, or during use of other medicines that can cause a sudden drop in blood pressure. Check with your doctor for instructions on monitoring your blood  pressure while taking this medicine. Your mouth may get dry. Chewing sugarless gum or sucking hard candy, and drinking plenty of water, may help. Contact your doctor if the problem does not go away or is severe. What side effects may I notice from receiving this medicine? Side effects that you should report to your doctor or health care professional as soon as possible:  allergic reactions like skin rash, itching or hives, swelling of the face, lips, or tongue  anxious  breathing problems  confusion  changes in vision  chest pain  confusion  elevated mood, decreased need for sleep, racing thoughts, impulsive behavior  eye pain  fast, irregular heartbeat  feeling faint or lightheaded, falls  feeling agitated, angry, or irritable  hallucination, loss of contact with reality  high blood pressure  loss of balance or coordination  palpitations  redness, blistering, peeling or loosening of the skin, including inside the mouth  restlessness, pacing, inability to keep still  seizures  stiff muscles  suicidal thoughts or other mood changes  trouble passing urine or change in the amount of urine  trouble sleeping  unusual bleeding or bruising  unusually weak or tired  vomiting  yellowing of the eyes or skin Side effects that usually do not require medical attention (report to your doctor or health care professional if they continue or are bothersome):  change in sex drive  or performance  change in appetite or weight  constipation  dizziness  dry mouth  headache  increased sweating  nausea  tired This list may not describe all possible side effects. Call your doctor for medical advice about side effects. You may report side effects to FDA at 1-800-FDA-1088. Where should I keep my medicine? Keep out of the reach of children. Store at room temperature between 15 and 30 degrees C (59 to 86 degrees F). Throw away any unused medicine after the  expiration date. NOTE: This sheet is a summary. It may not cover all possible information. If you have questions about this medicine, talk to your doctor, pharmacist, or health care provider.  2020 Elsevier/Gold Standard (2019-01-07 13:47:50)

## 2020-06-08 NOTE — Progress Notes (Signed)
Subjective:  Patient ID: Tamara Weaver, female    DOB: 1991-08-30  Age: 29 y.o. MRN: [id number]  CC: Establish Care (New patient physical, would like to discuss anxiety and weight gain)  HPI  GAD (generalized anxiety disorder) Chronic, waxing and waning Preoccupation with death, but no SI No hx of hospitalization, no ETOH use, no tobacco use, no illicit drug use. No Hx of suicide PHQ-9 13/27 and GAD-7 13/21  Check TSH, CBC, BMP Start cymbalta Entered referral to therapist Avoid SSRI due to potential weight gain as side effect F/up in 75month  Class 1 obesity due to excess calories with serious comorbidity and body mass index (BMI) of 33.0 to 33.9 in adult Difficulty with weight loss despite decreased portion, heart health diet, and exercise 3x/week. Admits with emotional eating. Check BMP and TSH Consider referral to weight loss clinic  S/p tubal ligation Reviewed past Medical, Social and Family history today.  Outpatient Medications Prior to Visit  Medication Sig Dispense Refill  . Ascorbic Acid (VITAMIN C) 1000 MG tablet Take 1,000 mg by mouth daily.    . Multiple Vitamin (MULTI-VITAMIN) tablet Take 1 tablet by mouth daily.    . Multiple Vitamins-Minerals (VITAMIN D3 COMPLETE) TABS Take by mouth.    Marland Kitchen albuterol (PROVENTIL HFA;VENTOLIN HFA) 108 (90 BASE) MCG/ACT inhaler Inhale 2 puffs into the lungs every 6 (six) hours as needed for wheezing or shortness of breath. (Patient not taking: Reported on 04/11/2018) 1 Inhaler 1  . amoxicillin-clavulanate (AUGMENTIN) 875-125 MG tablet Take 1 tablet by mouth 2 (two) times daily. 28 tablet 0  . azithromycin (ZITHROMAX) 250 MG tablet Take as directed 6 tablet 0  . Multiple Vitamins-Calcium (ONE-A-DAY WOMENS PO) Take 1 tablet by mouth daily.    . naproxen (NAPROSYN) 500 MG tablet Take 1 tablet (500 mg total) by mouth 2 (two) times daily between meals as needed for moderate pain. 30 tablet 0   No facility-administered medications prior to  visit.    ROS See HPI  Objective:  BP 130/86 (BP Location: Left Arm, Patient Position: Sitting, Cuff Size: Normal)   Pulse 94   Temp (!) 97.5 F (36.4 C) (Temporal)   Ht 5\' 1"  (1.549 m)   Wt 177 lb 12.8 oz (80.6 kg)   SpO2 99%   BMI 33.60 kg/m   Physical Exam Neck:     Thyroid: No thyroid mass, thyromegaly or thyroid tenderness.  Cardiovascular:     Rate and Rhythm: Normal rate and regular rhythm.     Pulses: Normal pulses.     Heart sounds: Normal heart sounds.  Pulmonary:     Effort: Pulmonary effort is normal.     Breath sounds: Normal breath sounds.  Musculoskeletal:     Cervical back: Normal range of motion and neck supple.  Lymphadenopathy:     Cervical: No cervical adenopathy.  Neurological:     Mental Status: She is oriented to person, place, and time.  Psychiatric:        Mood and Affect: Mood is anxious. Affect is tearful.        Speech: Speech normal.        Behavior: Behavior is cooperative.        Thought Content: Thought content is not paranoid or delusional. Thought content does not include homicidal or suicidal ideation. Thought content does not include homicidal or suicidal plan.        Cognition and Memory: Cognition normal.        Judgment: Judgment normal.  Assessment & Plan:  This visit occurred during the SARS-CoV-2 public health emergency.  Safety protocols were in place, including screening questions prior to the visit, additional usage of staff PPE, and extensive cleaning of exam room while observing appropriate contact time as indicated for disinfecting solutions.   Aleigh was seen today for establish care.  Diagnoses and all orders for this visit:  GAD (generalized anxiety disorder) -     DULoxetine (CYMBALTA) 20 MG capsule; Take 1 capsule (20 mg total) by mouth daily. -     TSH -     Ambulatory referral to Psychology  Class 1 obesity due to excess calories with serious comorbidity and body mass index (BMI) of 33.0 to 33.9 in adult -      Basic metabolic panel -     Lipid panel -     TSH -     Hemoglobin A1c  Encounter for lipid screening for cardiovascular disease -     Lipid panel    Problem List Items Addressed This Visit      Other   Class 1 obesity due to excess calories with serious comorbidity and body mass index (BMI) of 33.0 to 33.9 in adult    Difficulty with weight loss despite decreased portion, heart health diet, and exercise 3x/week. Admits with emotional eating. Check BMP and TSH Consider referral to weight loss clinic      Relevant Orders   Basic metabolic panel   Lipid panel   TSH   Hemoglobin A1c   GAD (generalized anxiety disorder) - Primary    Chronic, waxing and waning Preoccupation with death, but no SI No hx of hospitalization, no ETOH use, no tobacco use, no illicit drug use. No Hx of suicide PHQ-9 13/27 and GAD-7 13/21  Check TSH, CBC, BMP Start cymbalta Entered referral to therapist Avoid SSRI due to potential weight gain as side effect F/up in 1month      Relevant Medications   DULoxetine (CYMBALTA) 20 MG capsule   Other Relevant Orders   TSH   Ambulatory referral to Psychology    Other Visit Diagnoses    Encounter for lipid screening for cardiovascular disease       Relevant Orders   Lipid panel      Follow-up: Return in about 4 weeks (around 07/06/2020) for anxiety and weight management ( ).  Alysia Penna, NP

## 2020-06-14 ENCOUNTER — Telehealth: Payer: Self-pay | Admitting: Nurse Practitioner

## 2020-06-14 NOTE — Telephone Encounter (Signed)
Patient is calling and wanted to speak to someone regarding her labs, please advise, CB is [phone].

## 2020-06-14 NOTE — Telephone Encounter (Signed)
Pt has been contacted and labs have been discussed.

## 2020-06-16 ENCOUNTER — Telehealth: Payer: Self-pay | Admitting: Nurse Practitioner

## 2020-06-16 DIAGNOSIS — Z6833 Body mass index (BMI) 33.0-33.9, adult: Secondary | ICD-10-CM

## 2020-06-16 NOTE — Telephone Encounter (Signed)
Referral entered  

## 2020-06-16 NOTE — Telephone Encounter (Signed)
-----   Message from Tamara Weaver, [place] sent at 06/14/2020  1:12 PM EDT ----- Patient notified and verbalized understanding Pt states she would like the referral to a weight loss clinic.

## 2020-06-20 DIAGNOSIS — Z20822 Contact with and (suspected) exposure to covid-19: Secondary | ICD-10-CM | POA: Diagnosis not present

## 2020-07-06 ENCOUNTER — Encounter (INDEPENDENT_AMBULATORY_CARE_PROVIDER_SITE_OTHER): Payer: Self-pay | Admitting: Family Medicine

## 2020-07-06 ENCOUNTER — Ambulatory Visit (INDEPENDENT_AMBULATORY_CARE_PROVIDER_SITE_OTHER): Payer: Medicaid Other | Admitting: Family Medicine

## 2020-07-06 ENCOUNTER — Other Ambulatory Visit: Payer: Self-pay

## 2020-07-06 VITALS — BP 111/71 | HR 90 | Temp 98.3°F | Ht 61.0 in | Wt 171.0 lb

## 2020-07-06 DIAGNOSIS — R5383 Other fatigue: Secondary | ICD-10-CM | POA: Diagnosis not present

## 2020-07-06 DIAGNOSIS — Z6832 Body mass index (BMI) 32.0-32.9, adult: Secondary | ICD-10-CM

## 2020-07-06 DIAGNOSIS — E669 Obesity, unspecified: Secondary | ICD-10-CM | POA: Diagnosis not present

## 2020-07-06 DIAGNOSIS — Z0289 Encounter for other administrative examinations: Secondary | ICD-10-CM

## 2020-07-06 DIAGNOSIS — Z1331 Encounter for screening for depression: Secondary | ICD-10-CM | POA: Diagnosis not present

## 2020-07-06 DIAGNOSIS — E782 Mixed hyperlipidemia: Secondary | ICD-10-CM

## 2020-07-06 DIAGNOSIS — R0602 Shortness of breath: Secondary | ICD-10-CM

## 2020-07-06 DIAGNOSIS — R739 Hyperglycemia, unspecified: Secondary | ICD-10-CM | POA: Diagnosis not present

## 2020-07-06 DIAGNOSIS — Z9189 Other specified personal risk factors, not elsewhere classified: Secondary | ICD-10-CM

## 2020-07-06 DIAGNOSIS — E559 Vitamin D deficiency, unspecified: Secondary | ICD-10-CM | POA: Diagnosis not present

## 2020-07-07 LAB — LIPID PANEL
Chol/HDL Ratio: 3.8 ratio (ref 0.0–4.4)
Cholesterol, Total: 226 mg/dL — ABNORMAL HIGH (ref 100–199)
HDL: 59 mg/dL (ref >39)
LDL Chol Calc (NIH): 154 mg/dL — ABNORMAL HIGH (ref 0–99)
Triglycerides: 75 mg/dL (ref 0–149)
VLDL Cholesterol Cal: 13 mg/dL (ref 5–40)

## 2020-07-07 LAB — COMPREHENSIVE METABOLIC PANEL
ALT: 33 IU/L — ABNORMAL HIGH (ref 0–32)
AST: 19 IU/L (ref 0–40)
Albumin/Globulin Ratio: 1.8 (ref 1.2–2.2)
Albumin: 4.8 g/dL (ref 3.9–5.0)
Alkaline Phosphatase: 118 IU/L (ref 44–121)
BUN/Creatinine Ratio: 9 (ref 9–23)
BUN: 7 mg/dL (ref 6–20)
Bilirubin Total: 0.2 mg/dL (ref 0.0–1.2)
CO2: 24 mmol/L (ref 20–29)
Calcium: 9.7 mg/dL (ref 8.7–10.2)
Chloride: 101 mmol/L (ref 96–106)
Creatinine, Ser: 0.79 mg/dL (ref 0.57–1.00)
GFR calc Af Amer: 119 mL/min/{1.73_m2} (ref >59)
GFR calc non Af Amer: 103 mL/min/{1.73_m2} (ref >59)
Globulin, Total: 2.7 g/dL (ref 1.5–4.5)
Glucose: 88 mg/dL (ref 65–99)
Potassium: 4.4 mmol/L (ref 3.5–5.2)
Sodium: 139 mmol/L (ref 134–144)
Total Protein: 7.5 g/dL (ref 6.0–8.5)

## 2020-07-07 LAB — HEMOGLOBIN A1C
Est. average glucose Bld gHb Est-mCnc: 117 mg/dL
Hgb A1c MFr Bld: 5.7 % — ABNORMAL HIGH (ref 4.8–5.6)

## 2020-07-07 LAB — CBC WITH DIFFERENTIAL/PLATELET
Basophils Absolute: 0.1 10*3/uL (ref 0.0–0.2)
Basos: 1 %
EOS (ABSOLUTE): 0.1 10*3/uL (ref 0.0–0.4)
Eos: 1 %
Hematocrit: 41.4 % (ref 34.0–46.6)
Hemoglobin: 14.1 g/dL (ref 11.1–15.9)
Immature Grans (Abs): 0 10*3/uL (ref 0.0–0.1)
Immature Granulocytes: 0 %
Lymphocytes Absolute: 2.8 10*3/uL (ref 0.7–3.1)
Lymphs: 32 %
MCH: 29.3 pg (ref 26.6–33.0)
MCHC: 34.1 g/dL (ref 31.5–35.7)
MCV: 86 fL (ref 79–97)
Monocytes Absolute: 0.5 10*3/uL (ref 0.1–0.9)
Monocytes: 6 %
Neutrophils Absolute: 5.3 10*3/uL (ref 1.4–7.0)
Neutrophils: 60 %
Platelets: 355 10*3/uL (ref 150–450)
RBC: 4.82 x10E6/uL (ref 3.77–5.28)
RDW: 12.6 % (ref 11.7–15.4)
WBC: 8.7 10*3/uL (ref 3.4–10.8)

## 2020-07-07 LAB — VITAMIN B12: Vitamin B-12: 1095 pg/mL (ref 232–1245)

## 2020-07-07 LAB — INSULIN, RANDOM: INSULIN: 27.7 u[IU]/mL — ABNORMAL HIGH (ref 2.6–24.9)

## 2020-07-07 LAB — FOLATE: Folate: 15 ng/mL (ref >3.0)

## 2020-07-07 LAB — T4: T4, Total: 9.3 ug/dL (ref 4.5–12.0)

## 2020-07-07 LAB — VITAMIN D 25 HYDROXY (VIT D DEFICIENCY, FRACTURES): Vit D, 25-Hydroxy: 37.6 ng/mL (ref 30.0–100.0)

## 2020-07-07 LAB — T3: T3, Total: 124 ng/dL (ref 71–180)

## 2020-07-07 LAB — TSH: TSH: 1.21 u[IU]/mL (ref 0.450–4.500)

## 2020-07-13 NOTE — Progress Notes (Signed)
Chief Complaint:   OBESITY Tamara Weaver (MR# [id number]) is a 29 y.o. female who presents for evaluation and treatment of obesity and related comorbidities. Current BMI is Body mass index is 32.31 kg/m. Tamara Weaver has been struggling with her weight for many years and has been unsuccessful in either losing weight, maintaining weight loss, or reaching her healthy weight goal.  Tamara Weaver is currently in the action stage of change and ready to dedicate time achieving and maintaining a healthier weight. Tamara Weaver is interested in becoming our patient and working on intensive lifestyle modifications including (but not limited to) diet and exercise for weight loss.  Tamara Weaver habits were reviewed today and are as follows: Her family eats meals together, her desired weight loss is 26-36 lbs, she started gaining weight in college and after her last pregnancy, her heaviest weight ever was 173 pounds, she has significant food cravings issues, she skips meals frequently, she is frequently drinking liquids with calories, she has problems with excessive hunger, she frequently eats larger portions than normal and she struggles with emotional eating.  Depression Screen Tamara Weaver Food and Mood (modified PHQ-9) score was 12.  Depression screen PHQ 2/9 07/06/2020  Decreased Interest 0  Down, Depressed, Hopeless 2  PHQ - 2 Score 2  Altered sleeping 2  Tired, decreased energy 1  Change in appetite 2  Feeling bad or failure about yourself  2  Trouble concentrating 3  Moving slowly or fidgety/restless 0  Suicidal thoughts 0  PHQ-9 Score 12  Difficult doing work/chores Not difficult at all   Subjective:   1. Other fatigue Tamara Weaver admits to daytime somnolence and admits to waking up still tired. Patent has a history of symptoms of daytime fatigue. Tamara Weaver generally gets 4 or 5 hours of sleep per night, and states that she has nightime awakenings. Snoring is present. Apneic episodes are not present. Epworth  Sleepiness Score is 11.  2. Shortness of breath on exertion Tamara Weaver notes increasing shortness of breath with exercising and seems to be worsening over time with weight gain. She notes getting out of breath sooner with activity than she used to. This has not gotten worse recently. Tamara Weaver denies shortness of breath at rest or orthopnea.  3. Mixed hyperlipidemia Tamara Weaver has a histoey of elevated LDL, and she denies chest pain. She would like to improve with diet.  4. Hyperglycemia Tamara Weaver has a history of elevated glucose levels, and she notes polyphagia.  5. Vitamin D deficiency Tamara Weaver is on Vit D and she notes fatigue. She has no recent labs.  6. At risk for heart disease Tamara Weaver is at a higher than average risk for cardiovascular disease due to obesity.   Assessment/Plan:   1. Other fatigue Tamara Weaver does feel that her weight is causing her energy to be lower than it should be. Fatigue may be related to obesity, depression or many other causes. Labs will be ordered, and in the meanwhile, Tamara Weaver will focus on self care including making healthy food choices, increasing physical activity and focusing on stress reduction.  - EKG 12-Lead - Vitamin B12 - CBC with Differential/Platelet - Folate - T3 - T4 - TSH  2. Shortness of breath on exertion Tamara Weaver does feel that she gets out of breath more easily that she used to when she exercises. Tamara Weaver shortness of breath appears to be obesity related and exercise induced. She has agreed to work on weight loss and gradually increase exercise to treat her exercise induced shortness of breath. Will  continue to monitor closely.  3. Mixed hyperlipidemia Cardiovascular risk and specific lipid/LDL goals reviewed. We discussed several lifestyle modifications today. We will check labs today. Tamara Weaver will start her Category 2 plan, and will continue to work on exercise and weight loss efforts. Orders and follow up as documented in patient record.   - Lipid  panel  4. Hyperglycemia Fasting labs will be obtained today, and results with be discussed with Tamara Weaver in 2 weeks at her follow up visit. In the meanwhile Tamara Weaver will start her Category 2 plan and will work on weight loss efforts.  - Comprehensive metabolic panel - Hemoglobin A1c - Insulin, random  5. Vitamin D deficiency Low Vitamin D level contributes to fatigue and are associated with obesity, breast, and colon cancer. We will check labs today, and Tamara Weaver will follow-up for routine testing of Vitamin D, at least 2-3 times per year to avoid over-replacement.  - VITAMIN D 25 Hydroxy (Vit-D Deficiency, Fractures)  6. Depression screening Tamara Weaver had a positive depression screening. Depression is commonly associated with obesity and often results in emotional eating behaviors. We will monitor this closely and work on CBT to help improve the non-hunger eating patterns. Referral to Psychology may be required if no improvement is seen as she continues in our clinic.  7. At risk for heart disease Tamara Weaver was given approximately 15 minutes of coronary artery disease prevention counseling today. She is 29 y.o. female and has risk factors for heart disease including obesity. We discussed intensive lifestyle modifications today with an emphasis on specific weight loss instructions and strategies.   Repetitive spaced learning was employed today to elicit superior memory formation and behavioral change.  8. Class 1 obesity with serious comorbidity and body mass index (BMI) of 32.0 to 32.9 in adult, unspecified obesity type Tamara Weaver is currently in the action stage of change and her goal is to continue with weight loss efforts. I recommend Tamara Weaver begin the structured treatment plan as follows:  She has agreed to the Category 2 Plan + 100 calories.  Exercise goals: No exercise has been prescribed for now, while we concentrate on nutritional changes.  Behavioral modification strategies: increasing lean  protein intake and no skipping meals.  She was informed of the importance of frequent follow-up visits to maximize her success with intensive lifestyle modifications for her multiple health conditions. She was informed we would discuss her lab results at her next visit unless there is a critical issue that needs to be addressed sooner. Tamara Weaver agreed to keep her next visit at the agreed upon time to discuss these results.  Objective:   Blood pressure 111/71, pulse 90, temperature 98.3 F (36.8 C), height 5\' 1"  (1.549 m), weight 171 lb (77.6 kg), last menstrual period 06/12/2020, SpO2 100 %. Body mass index is 32.31 kg/m.  EKG: Normal sinus rhythm, rate 80 BPM.  Indirect Calorimeter completed today shows a VO2 of 273 and a REE of 1906.  Her calculated basal metabolic rate is 06/14/2020 thus her basal metabolic rate is better than expected.  General: Cooperative, alert, well developed, in no acute distress. HEENT: Conjunctivae and lids unremarkable. Cardiovascular: Regular rhythm.  Lungs: Normal work of breathing. Neurologic: No focal deficits.   Lab Results  Component Value Date   CREATININE 0.79 07/06/2020   BUN 7 07/06/2020   NA 139 07/06/2020   K 4.4 07/06/2020   CL 101 07/06/2020   CO2 24 07/06/2020   Lab Results  Component Value Date   ALT 33 (H)  07/06/2020   AST 19 07/06/2020   ALKPHOS 118 07/06/2020   BILITOT 0.2 07/06/2020   Lab Results  Component Value Date   HGBA1C 5.7 (H) 07/06/2020   HGBA1C 5.6 06/08/2020   Lab Results  Component Value Date   INSULIN 27.7 (H) 07/06/2020   Lab Results  Component Value Date   TSH 1.210 07/06/2020   Lab Results  Component Value Date   CHOL 226 (H) 07/06/2020   HDL 59 07/06/2020   LDLCALC 154 (H) 07/06/2020   TRIG 75 07/06/2020   CHOLHDL 3.8 07/06/2020   Lab Results  Component Value Date   WBC 8.7 07/06/2020   HGB 14.1 07/06/2020   HCT 41.4 07/06/2020   MCV 86 07/06/2020   PLT 355 07/06/2020   No results found for:  IRON, TIBC, FERRITIN  Attestation Statements:   Reviewed by clinician on day of visit: allergies, medications, problem list, medical history, surgical history, family history, social history, and previous encounter notes.   I, Burt Knack, am acting as transcriptionist for Quillian Quince, MD.  I have reviewed the above documentation for accuracy and completeness, and I agree with the above. - Quillian Quince, MD

## 2020-07-14 ENCOUNTER — Ambulatory Visit: Payer: Medicaid Other | Admitting: Nurse Practitioner

## 2020-07-20 ENCOUNTER — Other Ambulatory Visit: Payer: Self-pay

## 2020-07-20 ENCOUNTER — Ambulatory Visit (INDEPENDENT_AMBULATORY_CARE_PROVIDER_SITE_OTHER): Payer: Medicaid Other | Admitting: Family Medicine

## 2020-07-20 VITALS — BP 119/73 | HR 89 | Temp 98.4°F | Ht 61.0 in | Wt 169.0 lb

## 2020-07-20 DIAGNOSIS — Z6830 Body mass index (BMI) 30.0-30.9, adult: Secondary | ICD-10-CM | POA: Diagnosis not present

## 2020-07-20 DIAGNOSIS — E559 Vitamin D deficiency, unspecified: Secondary | ICD-10-CM

## 2020-07-20 DIAGNOSIS — E669 Obesity, unspecified: Secondary | ICD-10-CM | POA: Diagnosis not present

## 2020-07-20 DIAGNOSIS — Z9189 Other specified personal risk factors, not elsewhere classified: Secondary | ICD-10-CM | POA: Diagnosis not present

## 2020-07-20 DIAGNOSIS — R7303 Prediabetes: Secondary | ICD-10-CM | POA: Diagnosis not present

## 2020-07-20 DIAGNOSIS — E7849 Other hyperlipidemia: Secondary | ICD-10-CM | POA: Diagnosis not present

## 2020-07-20 DIAGNOSIS — R7401 Elevation of levels of liver transaminase levels: Secondary | ICD-10-CM | POA: Insufficient documentation

## 2020-07-20 MED ORDER — METFORMIN HCL 500 MG PO TABS: 500.0000 mg | ORAL_TABLET | Freq: Every morning | ORAL | 0 refills | Status: DC

## 2020-07-20 MED ORDER — VITAMIN D (ERGOCALCIFEROL) 1.25 MG (50000 UNIT) PO CAPS: 50000.0000 [IU] | ORAL_CAPSULE | ORAL | 0 refills | Status: DC

## 2020-07-24 NOTE — Progress Notes (Signed)
Chief Complaint:   OBESITY Tamara Weaver is here to discuss her progress with her obesity treatment plan along with follow-up of her obesity related diagnoses. Tamara Weaver is on the Category 2 Plan + 100 calories and states she is following her eating plan approximately 50% of the time. Tamara Weaver states she is doing 0 minutes 0 times per week.  Today's visit was #: 2 Starting weight: 171 lbs Starting date: 07/06/2020 Today's weight: 169 lbs Today's date: 07/20/2020 Total lbs lost to date: 2 Total lbs lost since last in-office visit: 2  Interim History: Tamara Weaver struggled to get on a routine with her eating plan. She sometimes skipped meals and struggled to eat all of her food at times.  Subjective:   1. Elevated ALT measurement Tamara Weaver's ALT is mildly elevated. She denies abdominal pain or jaundice. I discussed labs with the patient today.  2. Other hyperlipidemia, pure Tamara Weaver's LDL is elevated, but HDL and triglycerides are within normal limits. I discussed labs with the patient today.  3. Vitamin D deficiency Tamara Weaver's Vit D level is not at goal, and she notes fatigue. I discussed labs with the patient today.  4. Pre-diabetes Tamara Weaver's A1c and insulin are lelevated. She notes a family history of diabetes mellitus, and she notes polyphagia. I discussed labs with the patient today.  5. At risk for diabetes mellitus Tamara Weaver is at higher than average risk for developing diabetes due to her obesity.   Assessment/Plan:   1. 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 We will recheck labs in 3 months.  2. Other hyperlipidemia, pure Cardiovascular risk and specific lipid/LDL goals reviewed. We discussed several lifestyle modifications today. Tamara Weaver will continue to work on diet, exercise and weight loss efforts. We will recheck labs in 3 months. Orders and follow up as documented in patient record.   3. Vitamin D  deficiency Low Vitamin D level contributes to fatigue and are associated with obesity, breast, and colon cancer. Tamara Weaver agreed to start prescription Vitamin D 50,000 IU every week #4 with no refills. She will follow-up for routine testing of Vitamin D, at least 2-3 times per year to avoid over-replacement. We will recheck labs in 3 months.  - Vitamin D, Ergocalciferol, (DRISDOL) 1.25 MG (50000 UNIT) CAPS capsule; Take 1 capsule (50,000 Units total) by mouth every 7 (seven) days.  Dispense: 4 capsule; Refill: 0  4. Pre-diabetes Tamara Weaver will continue to work on weight loss, diet, exercise, and decreasing simple carbohydrates to help decrease the risk of diabetes. Tamara Weaver agreed to start metformin 500 mg q AM #30 with no refills.  - metFORMIN (GLUCOPHAGE) 500 MG tablet; Take 1 tablet (500 mg total) by mouth in the morning.  Dispense: 30 tablet; Refill: 0  5. At risk for diabetes mellitus Tamara Weaver was given approximately 30 minutes of diabetes education and counseling today. We discussed intensive lifestyle modifications today with an emphasis on weight loss as well as increasing exercise and decreasing simple carbohydrates in her diet. We also reviewed medication options with an emphasis on risk versus benefit of those discussed.   Repetitive spaced learning was employed today to elicit superior memory formation and behavioral change.  6. Class 1 obesity with serious comorbidity and body mass index (BMI) of 30.0 to 30.9 in adult, unspecified obesity type Tamara Weaver is currently in the action stage of change. As such, her goal is to continue with weight loss efforts. She has agreed to the Category  2 Plan + 100 calories.   Behavioral modification strategies: increasing lean protein intake and no skipping meals.  Tamara Weaver has agreed to follow-up with our clinic in 2 weeks. She was informed of the importance of frequent follow-up visits to maximize her success with intensive lifestyle modifications for her  multiple health conditions.   Objective:   Blood pressure 119/73, pulse 89, temperature 98.4 F (36.9 C), height 5\' 1"  (1.549 m), weight 169 lb (76.7 kg), SpO2 98 %. Body mass index is 31.93 kg/m.  General: Cooperative, alert, well developed, in no acute distress. HEENT: Conjunctivae and lids unremarkable. Cardiovascular: Regular rhythm.  Lungs: Normal work of breathing. Neurologic: No focal deficits.   Lab Results  Component Value Date   CREATININE 0.79 07/06/2020   BUN 7 07/06/2020   NA 139 07/06/2020   K 4.4 07/06/2020   CL 101 07/06/2020   CO2 24 07/06/2020   Lab Results  Component Value Date   ALT 33 (H) 07/06/2020   AST 19 07/06/2020   ALKPHOS 118 07/06/2020   BILITOT 0.2 07/06/2020   Lab Results  Component Value Date   HGBA1C 5.7 (H) 07/06/2020   HGBA1C 5.6 06/08/2020   Lab Results  Component Value Date   INSULIN 27.7 (H) 07/06/2020   Lab Results  Component Value Date   TSH 1.210 07/06/2020   Lab Results  Component Value Date   CHOL 226 (H) 07/06/2020   HDL 59 07/06/2020   LDLCALC 154 (H) 07/06/2020   TRIG 75 07/06/2020   CHOLHDL 3.8 07/06/2020   Lab Results  Component Value Date   WBC 8.7 07/06/2020   HGB 14.1 07/06/2020   HCT 41.4 07/06/2020   MCV 86 07/06/2020   PLT 355 07/06/2020   No results found for: IRON, TIBC, FERRITIN  Attestation Statements:   Reviewed by clinician on day of visit: allergies, medications, problem list, medical history, surgical history, family history, social history, and previous encounter notes.   I, 07/08/2020, am acting as transcriptionist for Burt Knack, MD.  I have reviewed the above documentation for accuracy and completeness, and I agree with the above. -  Quillian Quince, MD

## 2020-07-25 DIAGNOSIS — J029 Acute pharyngitis, unspecified: Secondary | ICD-10-CM | POA: Diagnosis not present

## 2020-07-25 DIAGNOSIS — Z20822 Contact with and (suspected) exposure to covid-19: Secondary | ICD-10-CM | POA: Diagnosis not present

## 2020-07-28 DIAGNOSIS — J029 Acute pharyngitis, unspecified: Secondary | ICD-10-CM | POA: Diagnosis not present

## 2020-07-28 DIAGNOSIS — Z20822 Contact with and (suspected) exposure to covid-19: Secondary | ICD-10-CM | POA: Diagnosis not present

## 2020-08-01 ENCOUNTER — Ambulatory Visit (INDEPENDENT_AMBULATORY_CARE_PROVIDER_SITE_OTHER): Payer: Medicaid Other | Admitting: Clinical

## 2020-08-01 DIAGNOSIS — F419 Anxiety disorder, unspecified: Secondary | ICD-10-CM | POA: Diagnosis not present

## 2020-08-02 ENCOUNTER — Other Ambulatory Visit: Payer: Self-pay

## 2020-08-02 DIAGNOSIS — F411 Generalized anxiety disorder: Secondary | ICD-10-CM

## 2020-08-02 NOTE — Telephone Encounter (Signed)
Insurance is suggesting refilling patient Rx for 90 day supply instead of 30 day. Please advise.

## 2020-08-03 ENCOUNTER — Ambulatory Visit (INDEPENDENT_AMBULATORY_CARE_PROVIDER_SITE_OTHER): Payer: Medicaid Other | Admitting: Physician Assistant

## 2020-08-03 ENCOUNTER — Encounter (INDEPENDENT_AMBULATORY_CARE_PROVIDER_SITE_OTHER): Payer: Self-pay | Admitting: Physician Assistant

## 2020-08-03 ENCOUNTER — Other Ambulatory Visit: Payer: Self-pay

## 2020-08-03 VITALS — BP 109/74 | HR 90 | Temp 98.4°F | Ht 61.0 in | Wt 171.0 lb

## 2020-08-03 DIAGNOSIS — E559 Vitamin D deficiency, unspecified: Secondary | ICD-10-CM

## 2020-08-03 DIAGNOSIS — E669 Obesity, unspecified: Secondary | ICD-10-CM | POA: Diagnosis not present

## 2020-08-03 DIAGNOSIS — E7849 Other hyperlipidemia: Secondary | ICD-10-CM

## 2020-08-03 DIAGNOSIS — Z6832 Body mass index (BMI) 32.0-32.9, adult: Secondary | ICD-10-CM

## 2020-08-03 NOTE — Telephone Encounter (Signed)
No change in quantity at this time. She needs to schedule f/up with me

## 2020-08-07 MED ORDER — VITAMIN D (ERGOCALCIFEROL) 1.25 MG (50000 UNIT) PO CAPS: 50000.0000 [IU] | ORAL_CAPSULE | ORAL | 0 refills | Status: DC

## 2020-08-07 NOTE — Progress Notes (Signed)
Chief Complaint:   OBESITY Tamara Weaver is here to discuss her progress with her obesity treatment plan along with follow-up of her obesity related diagnoses. Tamara Weaver is on the Category 2 Plan + 100 calories and states she is following her eating plan approximately 60-70% of the time. Tamara Weaver states she is exercising 0 minutes 0 times per week.  Today's visit was #: 3 Starting weight: 171 lbs Starting date: 07/06/2020 Today's weight: 171 lbs Today's date: 08/03/2020 Total lbs lost to date: 0 Total lbs lost since last in-office visit: 0  Interim History: Tamara Weaver states that the metformin made her feel bloated the first few days. She reports missing meals on some days. 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  Subjective:   Vitamin D deficiency. Tamara Weaver is on prescription Vitamin D weekly, which she is tolerating well.   Ref. Range 07/06/2020 11:16  Vitamin D, 25-Hydroxy Latest Ref Range: 30.0 - 100.0 ng/mL 37.6   Other hyperlipidemia. Tamara Weaver has hyperlipidemia and has been trying to improve her cholesterol levels with intensive lifestyle modification including a low saturated fat diet, exercise and weight loss. She denies any chest pain, claudication or myalgias. Tamara Weaver is on no medication. LDL and total cholesterol levels were elevated on last labs.  Lab Results  Component Value Date   ALT 33 (H) 07/06/2020   AST 19 07/06/2020   ALKPHOS 118 07/06/2020   BILITOT 0.2 07/06/2020   Lab Results  Component Value Date   CHOL 226 (H) 07/06/2020   HDL 59 07/06/2020   LDLCALC 154 (H) 07/06/2020   TRIG 75 07/06/2020   CHOLHDL 3.8 07/06/2020   Assessment/Plan:   Vitamin D deficiency. Low Vitamin D level contributes to fatigue and are associated with obesity, breast, and colon cancer. She was given a refill on her Vitamin D, Ergocalciferol, (DRISDOL) 1.25 MG (50000 UNIT) CAPS capsule every week #4 with 0 refills and will follow-up for routine testing of Vitamin D, at least  2-3 times per year to avoid over-replacement.   Other hyperlipidemia. Cardiovascular risk and specific lipid/LDL goals reviewed.  We discussed several lifestyle modifications today and Tamara Weaver will continue to work on diet, exercise and weight loss efforts. Orders and follow up as documented in patient record.   Counseling Intensive lifestyle modifications are the first line treatment for this issue. . Dietary changes: Increase soluble fiber. Decrease simple carbohydrates. . Exercise changes: Moderate to vigorous-intensity aerobic activity 150 minutes per week if tolerated. . Lipid-lowering medications: see documented in medical record.  Class 1 obesity with serious comorbidity and body mass index (BMI) of 32.0 to 32.9 in adult, unspecified obesity type.  Tamara Weaver is currently in the action stage of change. As such, her goal is to continue with weight loss efforts. She has agreed to the Category 2 Plan + 100 calories.   Exercise goals: For substantial health benefits, adults should do at least 150 minutes (2 hours and 30 minutes) a week of moderate-intensity, or 75 minutes (1 hour and 15 minutes) a week of vigorous-intensity aerobic physical activity, or an equivalent combination of moderate- and vigorous-intensity aerobic activity. Aerobic activity should be performed in episodes of at least 10 minutes, and preferably, it should be spread throughout the week.  Behavioral modification strategies: meal planning and cooking strategies and keeping healthy foods in the home.  Tamara Weaver has agreed to follow-up with our clinic in 2 weeks. She was informed of the importance of frequent follow-up visits to maximize her success  with intensive lifestyle modifications for her multiple health conditions.   Objective:   Blood pressure 109/74, pulse 90, temperature 98.4 F (36.9 C), height 5\' 1"  (1.549 m), weight 171 lb (77.6 kg), SpO2 98 %. Body mass index is 32.31 kg/m.  General: Cooperative, alert, well  developed, in no acute distress. HEENT: Conjunctivae and lids unremarkable. Cardiovascular: Regular rhythm.  Lungs: Normal work of breathing. Neurologic: No focal deficits.   Lab Results  Component Value Date   CREATININE 0.79 07/06/2020   BUN 7 07/06/2020   NA 139 07/06/2020   K 4.4 07/06/2020   CL 101 07/06/2020   CO2 24 07/06/2020   Lab Results  Component Value Date   ALT 33 (H) 07/06/2020   AST 19 07/06/2020   ALKPHOS 118 07/06/2020   BILITOT 0.2 07/06/2020   Lab Results  Component Value Date   HGBA1C 5.7 (H) 07/06/2020   HGBA1C 5.6 06/08/2020   Lab Results  Component Value Date   INSULIN 27.7 (H) 07/06/2020   Lab Results  Component Value Date   TSH 1.210 07/06/2020   Lab Results  Component Value Date   CHOL 226 (H) 07/06/2020   HDL 59 07/06/2020   LDLCALC 154 (H) 07/06/2020   TRIG 75 07/06/2020   CHOLHDL 3.8 07/06/2020   Lab Results  Component Value Date   WBC 8.7 07/06/2020   HGB 14.1 07/06/2020   HCT 41.4 07/06/2020   MCV 86 07/06/2020   PLT 355 07/06/2020   No results found for: IRON, TIBC, FERRITIN  Attestation Statements:   Reviewed by clinician on day of visit: allergies, medications, problem list, medical history, surgical history, family history, social history, and previous encounter notes.  I09/18/2021, am acting as transcriptionist for Marianna Payment, PA-C   I have reviewed the above documentation for accuracy and completeness, and I agree with the above. Alois Cliche, PA-C

## 2020-08-07 NOTE — Telephone Encounter (Signed)
LVM for patient to return call. 

## 2020-08-11 ENCOUNTER — Other Ambulatory Visit (INDEPENDENT_AMBULATORY_CARE_PROVIDER_SITE_OTHER): Payer: Self-pay | Admitting: Family Medicine

## 2020-08-11 DIAGNOSIS — E559 Vitamin D deficiency, unspecified: Secondary | ICD-10-CM

## 2020-08-11 DIAGNOSIS — R7303 Prediabetes: Secondary | ICD-10-CM

## 2020-08-16 ENCOUNTER — Ambulatory Visit (INDEPENDENT_AMBULATORY_CARE_PROVIDER_SITE_OTHER): Payer: Medicaid Other | Admitting: Clinical

## 2020-08-16 DIAGNOSIS — F419 Anxiety disorder, unspecified: Secondary | ICD-10-CM

## 2020-08-17 ENCOUNTER — Other Ambulatory Visit: Payer: Self-pay

## 2020-08-17 ENCOUNTER — Encounter (INDEPENDENT_AMBULATORY_CARE_PROVIDER_SITE_OTHER): Payer: Self-pay | Admitting: Physician Assistant

## 2020-08-17 ENCOUNTER — Ambulatory Visit (INDEPENDENT_AMBULATORY_CARE_PROVIDER_SITE_OTHER): Payer: Medicaid Other | Admitting: Physician Assistant

## 2020-08-17 VITALS — BP 115/76 | HR 83 | Temp 98.3°F | Ht 61.0 in | Wt 173.0 lb

## 2020-08-17 DIAGNOSIS — E669 Obesity, unspecified: Secondary | ICD-10-CM | POA: Diagnosis not present

## 2020-08-17 DIAGNOSIS — R7303 Prediabetes: Secondary | ICD-10-CM | POA: Diagnosis not present

## 2020-08-17 DIAGNOSIS — E559 Vitamin D deficiency, unspecified: Secondary | ICD-10-CM

## 2020-08-17 DIAGNOSIS — Z6832 Body mass index (BMI) 32.0-32.9, adult: Secondary | ICD-10-CM | POA: Diagnosis not present

## 2020-08-17 MED ORDER — METFORMIN HCL 500 MG PO TABS: 500.0000 mg | ORAL_TABLET | Freq: Every day | ORAL | 0 refills | Status: DC

## 2020-08-17 NOTE — Progress Notes (Signed)
Chief Complaint:   OBESITY Tamara Weaver is here to discuss her progress with her obesity treatment plan along with follow-up of her obesity related diagnoses. Tamara Weaver is on the Category 2 Plan + 100 calories and states she is following her eating plan approximately 45% of the time. Tamara Weaver states she is exercising 0 minutes 0 times per week.  Today's visit was #: 4 Starting weight: 171 lbs Starting date: 07/06/2020 Today's weight: 173 lbs Today's date: 08/17/2020 Total lbs lost to date: 0 Total lbs lost since last in-office visit: 0  Interim History: Tamara Weaver states that she has been bloated after eating the yogurt and thinks she is lactose intolerant. She reports eating more fast food the last 2 weeks. She wants to start exercising.  Subjective:   Prediabetes. Tamara Weaver has a diagnosis of prediabetes based on her elevated HgA1c and was informed this puts her at greater risk of developing diabetes. She continues to work on diet and exercise to decrease her risk of diabetes. She denies nausea or hypoglycemia. Tamara Weaver is on metformin, which is causing some abdominal discomfort (is taking in the morning).  Lab Results  Component Value Date   HGBA1C 5.7 (H) 07/06/2020   Lab Results  Component Value Date   INSULIN 27.7 (H) 07/06/2020   Vitamin D deficiency. Tamara Weaver is on Vitamin D weekly. No nausea, vomiting, or muscle weakness.    Ref. Range 07/06/2020 11:16  Vitamin D, 25-Hydroxy Latest Ref Range: 30.0 - 100.0 ng/mL 37.6   Assessment/Plan:   Prediabetes. Tamara Weaver will continue to work on weight loss, exercise, and decreasing simple carbohydrates to help decrease the risk of diabetes. Refill was given for metFORMIN (GLUCOPHAGE) 500 MG tablet (will change to taking with dinner).  Vitamin D deficiency. Low Vitamin D level contributes to fatigue and are associated with obesity, breast, and colon cancer. She agrees to continue to take Vitamin D as directed and will follow-up for routine  testing of Vitamin D, at least 2-3 times per year to avoid over-replacement.  Class 1 obesity with serious comorbidity and body mass index (BMI) of 32.0 to 32.9 in adult, unspecified obesity type.  Tamara Weaver is currently in the action stage of change. As such, her goal is to continue with weight loss efforts. She has agreed to changing plans and will now follow the Category 3 Plan.   Exercise goals: For substantial health benefits, adults should do at least 150 minutes (2 hours and 30 minutes) a week of moderate-intensity, or 75 minutes (1 hour and 15 minutes) a week of vigorous-intensity aerobic physical activity, or an equivalent combination of moderate- and vigorous-intensity aerobic activity. Aerobic activity should be performed in episodes of at least 10 minutes, and preferably, it should be spread throughout the week.  Behavioral modification strategies: meal planning and cooking strategies and keeping healthy foods in the home.  Tamara Weaver has agreed to follow-up with our clinic in 2 weeks. She was informed of the importance of frequent follow-up visits to maximize her success with intensive lifestyle modifications for her multiple health conditions.   Objective:   Blood pressure 115/76, pulse 83, temperature 98.3 F (36.8 C), temperature source Oral, height 5\' 1"  (1.549 m), weight 173 lb (78.5 kg), last menstrual period 08/03/2020, SpO2 100 %. Body mass index is 32.69 kg/m.  General: Cooperative, alert, well developed, in no acute distress. HEENT: Conjunctivae and lids unremarkable. Cardiovascular: Regular rhythm.  Lungs: Normal work of breathing. Neurologic: No focal deficits.   Lab Results  Component Value  Date   CREATININE 0.79 07/06/2020   BUN 7 07/06/2020   NA 139 07/06/2020   K 4.4 07/06/2020   CL 101 07/06/2020   CO2 24 07/06/2020   Lab Results  Component Value Date   ALT 33 (H) 07/06/2020   AST 19 07/06/2020   ALKPHOS 118 07/06/2020   BILITOT 0.2 07/06/2020   Lab  Results  Component Value Date   HGBA1C 5.7 (H) 07/06/2020   HGBA1C 5.6 06/08/2020   Lab Results  Component Value Date   INSULIN 27.7 (H) 07/06/2020   Lab Results  Component Value Date   TSH 1.210 07/06/2020   Lab Results  Component Value Date   CHOL 226 (H) 07/06/2020   HDL 59 07/06/2020   LDLCALC 154 (H) 07/06/2020   TRIG 75 07/06/2020   CHOLHDL 3.8 07/06/2020   Lab Results  Component Value Date   WBC 8.7 07/06/2020   HGB 14.1 07/06/2020   HCT 41.4 07/06/2020   MCV 86 07/06/2020   PLT 355 07/06/2020   No results found for: IRON, TIBC, FERRITIN  Attestation Statements:   Reviewed by clinician on day of visit: allergies, medications, problem list, medical history, surgical history, family history, social history, and previous encounter notes.  IMarianna Payment, am acting as transcriptionist for Alois Cliche, PA-C   I have reviewed the above documentation for accuracy and completeness, and I agree with the above. Alois Cliche, PA-C

## 2020-08-29 ENCOUNTER — Ambulatory Visit (INDEPENDENT_AMBULATORY_CARE_PROVIDER_SITE_OTHER): Payer: Medicaid Other | Admitting: Physician Assistant

## 2020-08-29 NOTE — Telephone Encounter (Signed)
Unable to reach pt, pt also needs to schedule appt.

## 2020-09-09 ENCOUNTER — Other Ambulatory Visit (INDEPENDENT_AMBULATORY_CARE_PROVIDER_SITE_OTHER): Payer: Self-pay | Admitting: Physician Assistant

## 2020-09-09 DIAGNOSIS — R7303 Prediabetes: Secondary | ICD-10-CM

## 2020-09-20 ENCOUNTER — Ambulatory Visit (INDEPENDENT_AMBULATORY_CARE_PROVIDER_SITE_OTHER): Payer: Medicaid Other | Admitting: Clinical

## 2020-09-20 DIAGNOSIS — F419 Anxiety disorder, unspecified: Secondary | ICD-10-CM

## 2020-10-05 ENCOUNTER — Ambulatory Visit (INDEPENDENT_AMBULATORY_CARE_PROVIDER_SITE_OTHER): Payer: Medicaid Other | Admitting: Clinical

## 2020-10-05 DIAGNOSIS — F419 Anxiety disorder, unspecified: Secondary | ICD-10-CM | POA: Diagnosis not present

## 2020-10-21 DIAGNOSIS — R7303 Prediabetes: Secondary | ICD-10-CM

## 2020-10-21 HISTORY — DX: Prediabetes: R73.03

## 2020-11-08 ENCOUNTER — Ambulatory Visit (INDEPENDENT_AMBULATORY_CARE_PROVIDER_SITE_OTHER): Payer: Medicaid Other | Admitting: Clinical

## 2020-11-08 DIAGNOSIS — F419 Anxiety disorder, unspecified: Secondary | ICD-10-CM | POA: Diagnosis not present

## 2020-12-07 ENCOUNTER — Ambulatory Visit: Payer: Medicaid Other | Admitting: Clinical

## 2021-02-02 ENCOUNTER — Other Ambulatory Visit: Payer: Self-pay | Admitting: Sports Medicine

## 2021-02-02 MED ORDER — FLUCONAZOLE 150 MG PO TABS: 150.0000 mg | ORAL_TABLET | Freq: Every day | ORAL | 1 refills | Status: DC

## 2021-02-02 NOTE — Progress Notes (Signed)
Sent Diflucan   

## 2021-02-04 ENCOUNTER — Other Ambulatory Visit: Payer: Self-pay | Admitting: Sports Medicine

## 2021-02-04 MED ORDER — AMOXICILLIN-POT CLAVULANATE 875-125 MG PO TABS: 1.0000 | ORAL_TABLET | Freq: Two times a day (BID) | ORAL | 0 refills | Status: AC

## 2021-02-04 NOTE — Progress Notes (Signed)
Rx Augmentin

## 2021-06-14 DIAGNOSIS — H5213 Myopia, bilateral: Secondary | ICD-10-CM | POA: Diagnosis not present

## 2021-08-08 ENCOUNTER — Other Ambulatory Visit: Payer: Self-pay | Admitting: Sports Medicine

## 2021-08-08 MED ORDER — FLUCONAZOLE 150 MG PO TABS: 150.0000 mg | ORAL_TABLET | Freq: Every day | ORAL | 1 refills | Status: DC

## 2021-08-08 MED ORDER — AMOXICILLIN-POT CLAVULANATE 875-125 MG PO TABS: 1.0000 | ORAL_TABLET | Freq: Two times a day (BID) | ORAL | 0 refills | Status: DC

## 2021-08-08 NOTE — Progress Notes (Signed)
Sent Augmentin for infection

## 2021-10-23 ENCOUNTER — Other Ambulatory Visit: Payer: Self-pay

## 2021-10-23 ENCOUNTER — Ambulatory Visit
Admission: EM | Admit: 2021-10-23 | Discharge: 2021-10-23 | Disposition: A | Discharge status: Home / Self Care | Payer: Medicaid Other | Attending: Emergency Medicine | Admitting: Emergency Medicine

## 2021-10-23 DIAGNOSIS — B9789 Other viral agents as the cause of diseases classified elsewhere: Secondary | ICD-10-CM | POA: Diagnosis not present

## 2021-10-23 DIAGNOSIS — J028 Acute pharyngitis due to other specified organisms: Secondary | ICD-10-CM | POA: Diagnosis not present

## 2021-10-23 DIAGNOSIS — B349 Viral infection, unspecified: Secondary | ICD-10-CM | POA: Diagnosis not present

## 2021-10-23 DIAGNOSIS — R051 Acute cough: Secondary | ICD-10-CM

## 2021-10-23 MED ORDER — FLUTICASONE PROPIONATE 50 MCG/ACT NA SUSP: 1.0000 | Freq: Every day | NASAL | 2 refills | Status: AC

## 2021-10-23 MED ORDER — PREDNISONE 10 MG (21) PO TBPK: ORAL_TABLET | Freq: Every day | ORAL | 0 refills | Status: DC

## 2021-10-23 NOTE — ED Provider Notes (Signed)
[facility]    CSN: [id number] Arrival date & time: 10/23/21  1151      History   Chief Complaint Chief Complaint  Patient presents with   Cough    SORE THROAT     HPI Britainy Weaver is a 31 y.o. female.   Patient here for sore throat congestion for past couple of days states that this morning when she woke up she seemed more worse.  Patient denies any nausea vomiting or diarrhea denies any fevers.  Has taken some home remedies and ginger with slight relief.   Past Medical History:  Diagnosis Date   Anxiety    Asthma    Depression    Food allergy    Gallbladder problem    Lactose intolerance     Patient Active Problem List   Diagnosis Date Noted   Elevated ALT measurement 07/20/2020   Other hyperlipidemia 07/20/2020   Vitamin D deficiency 07/20/2020   Prediabetes 07/20/2020   At risk for diabetes mellitus 07/20/2020   Class 1 obesity due to excess calories with serious comorbidity and body mass index (BMI) of 33.0 to 33.9 in adult 06/08/2020   GAD (generalized anxiety disorder) 06/08/2020   Laryngopharyngeal reflux (LPR) 11/02/2019   Tonsillar calculus 11/02/2019    Past Surgical History:  Procedure Laterality Date   CHOLECYSTECTOMY  09/2012   NASAL SINUS SURGERY  01/2009   TUBAL LIGATION  10/2016    OB History     Gravida  2   Para  2   Term      Preterm      AB      Living         SAB      IAB      Ectopic      Multiple      Live Births               Home Medications    Prior to Admission medications   Medication Sig Start Date End Date Taking? Authorizing Provider  fluticasone (FLONASE) 50 MCG/ACT nasal spray Place 1 spray into both nostrils daily. 10/23/21  Yes Coralyn Mark, NP  predniSONE (STERAPRED UNI-PAK 21 TAB) 10 MG (21) TBPK tablet Take by mouth daily. Take 6 tabs by mouth daily  for 2 days, then 5 tabs for 2 days, then 4 tabs for 2 days, then 3 tabs for 2 days, 2 tabs for 2 days, then 1 tab by mouth  daily for 2 days 10/23/21  Yes Coralyn Mark, NP  amoxicillin-clavulanate (AUGMENTIN) 875-125 MG tablet Take 1 tablet by mouth 2 (two) times daily. 08/08/21   Asencion Islam, DPM  Ascorbic Acid (VITAMIN C) 1000 MG tablet Take 1,000 mg by mouth daily.    [provider]  DULoxetine (CYMBALTA) 20 MG capsule Take 1 capsule (20 mg total) by mouth daily. 06/08/20   Nche, Bonna Gains, NP  fluconazole (DIFLUCAN) 150 MG tablet Take 1 tablet (150 mg total) by mouth daily. 02/02/21   Asencion Islam, DPM  fluconazole (DIFLUCAN) 150 MG tablet Take 1 tablet (150 mg total) by mouth daily. 08/08/21   Asencion Islam, DPM  metFORMIN (GLUCOPHAGE) 500 MG tablet Take 1 tablet (500 mg total) by mouth daily with supper. 08/17/20   Alois Cliche, PA-C  Multiple Vitamin (MULTI-VITAMIN) tablet Take 1 tablet by mouth daily.    [provider]  Multiple Vitamins-Minerals (VITAMIN D3 COMPLETE) TABS Take by mouth.    [provider]  Vitamin D,  Ergocalciferol, (DRISDOL) 1.25 MG (50000 UNIT) CAPS capsule Take 1 capsule (50,000 Units total) by mouth every 7 (seven) days. 08/07/20   Alois ClicheAguilar, Tracey, PA-C    Family History Family History  Problem Relation Age of Onset   Asthma Mother    Diabetes Mother    Depression Mother    Anxiety disorder Mother     Social History Social History   Tobacco Use   Smoking status: Never   Smokeless tobacco: Never  Vaping Use   Vaping Use: Never used  Substance Use Topics   Alcohol use: Never   Drug use: Never     Allergies   Tomato   Review of Systems Review of Systems  Constitutional:  Negative for chills, fatigue and fever.  HENT:  Positive for congestion, postnasal drip, rhinorrhea, sneezing and sore throat. Negative for sinus pressure and sinus pain.   Eyes: Negative.   Respiratory:  Positive for cough. Negative for shortness of breath.   Cardiovascular: Negative.   Gastrointestinal: Negative.   Genitourinary: Negative.    Neurological: Negative.     Physical Exam Triage Vital Signs ED Triage Vitals [10/23/21 1234]  Enc Vitals Group     BP 127/68     Pulse Rate 86     Resp 16     Temp 98.1 F (36.7 C)     Temp Source Oral     SpO2 100 %     Weight      Height      Head Circumference      Peak Flow      Pain Score      Pain Loc      Pain Edu?      Excl. in GC?    No data found.  Updated Vital Signs BP 127/68 (BP Location: Left Arm)    Pulse 86    Temp 98.1 F (36.7 C) (Oral)    Resp 16    LMP 10/09/2021 (Approximate)    SpO2 100%   Visual Acuity Right Eye Distance:   Left Eye Distance:   Bilateral Distance:    Right Eye Near:   Left Eye Near:    Bilateral Near:     Physical Exam Constitutional:      Appearance: Normal appearance. She is obese.  HENT:     Right Ear: Tympanic membrane normal.     Ears:     Comments: Bilateral ear tympanic membrane bulging no erythema Cardiovascular:     Rate and Rhythm: Normal rate.  Pulmonary:     Effort: Pulmonary effort is normal.  Abdominal:     General: Abdomen is flat.  Skin:    General: Skin is warm.  Neurological:     General: No focal deficit present.     Mental Status: She is alert.     UC Treatments / Results  Labs (all labs ordered are listed, but only abnormal results are displayed) Labs Reviewed - No data to display  EKG   Radiology No results found.  Procedures Procedures (including critical care time)  Medications Ordered in UC Medications - No data to display  Initial Impression / Assessment and Plan / UC Course  I have reviewed the triage vital signs and the nursing notes.  Pertinent labs & imaging results that were available during my care of the patient were reviewed by me and considered in my medical decision making (see chart for details).     Take a Claritin daily You can take Tylenol or Motrin  as needed for pain Use a humidifier at night to help with the congestion Your illness is more viral in  nature no antibiotic is needed at this time Continue to use cough drops or over-the-counter cough and cold medication as needed Final Clinical Impressions(s) / UC Diagnoses   Final diagnoses:  Acute cough  Viral illness  Sore throat (viral)     Discharge Instructions      Take a Claritin daily You can take Tylenol or Motrin as needed for pain Use a humidifier at night to help with the congestion Your illness is more viral in nature no antibiotic is needed at this time Continue to use cough drops or over-the-counter cough and cold medication as needed     ED Prescriptions     Medication Sig Dispense Auth. Provider   fluticasone (FLONASE) 50 MCG/ACT nasal spray Place 1 spray into both nostrils daily. 16 g Maple Mirza L, NP   predniSONE (STERAPRED UNI-PAK 21 TAB) 10 MG (21) TBPK tablet Take by mouth daily. Take 6 tabs by mouth daily  for 2 days, then 5 tabs for 2 days, then 4 tabs for 2 days, then 3 tabs for 2 days, 2 tabs for 2 days, then 1 tab by mouth daily for 2 days 42 tablet Coralyn Mark, NP      PDMP not reviewed this encounter.   Coralyn Mark, NP 10/23/21 1341

## 2021-10-23 NOTE — Discharge Instructions (Addendum)
Take a Claritin daily You can take Tylenol or Motrin as needed for pain Use a humidifier at night to help with the congestion Your illness is more viral in nature no antibiotic is needed at this time Continue to use cough drops or over-the-counter cough and cold medication as needed

## 2021-11-28 ENCOUNTER — Ambulatory Visit: Payer: Medicaid Other | Admitting: Nurse Practitioner

## 2021-11-28 ENCOUNTER — Encounter: Payer: Self-pay | Admitting: Nurse Practitioner

## 2021-11-28 ENCOUNTER — Other Ambulatory Visit: Payer: Self-pay

## 2021-11-28 VITALS — BP 120/82 | HR 101 | Temp 97.7°F | Ht 61.0 in | Wt 182.0 lb

## 2021-11-28 DIAGNOSIS — R7303 Prediabetes: Secondary | ICD-10-CM | POA: Diagnosis not present

## 2021-11-28 DIAGNOSIS — F411 Generalized anxiety disorder: Secondary | ICD-10-CM

## 2021-11-28 DIAGNOSIS — E6609 Other obesity due to excess calories: Secondary | ICD-10-CM

## 2021-11-28 DIAGNOSIS — E559 Vitamin D deficiency, unspecified: Secondary | ICD-10-CM | POA: Diagnosis not present

## 2021-11-28 DIAGNOSIS — Z6834 Body mass index (BMI) 34.0-34.9, adult: Secondary | ICD-10-CM

## 2021-11-28 LAB — BASIC METABOLIC PANEL
BUN: 12 mg/dL (ref 6–23)
CO2: 23 mEq/L (ref 19–32)
Calcium: 9.8 mg/dL (ref 8.4–10.5)
Chloride: 105 mEq/L (ref 96–112)
Creatinine, Ser: 0.88 mg/dL (ref 0.40–1.20)
GFR: 88.95 mL/min (ref >60.00)
Glucose, Bld: 104 mg/dL — ABNORMAL HIGH (ref 70–99)
Potassium: 4.7 mEq/L (ref 3.5–5.1)
Sodium: 142 mEq/L (ref 135–145)

## 2021-11-28 LAB — LIPID PANEL
Cholesterol: 207 mg/dL — ABNORMAL HIGH (ref 0–200)
HDL: 58.6 mg/dL (ref >39.00)
LDL Cholesterol: 121 mg/dL — ABNORMAL HIGH (ref 0–99)
NonHDL: 148.19
Total CHOL/HDL Ratio: 4
Triglycerides: 137 mg/dL (ref 0.0–149.0)
VLDL: 27.4 mg/dL (ref 0.0–40.0)

## 2021-11-28 LAB — CBC
HCT: 39.7 % (ref 36.0–46.0)
Hemoglobin: 13.1 g/dL (ref 12.0–15.0)
MCHC: 33 g/dL (ref 30.0–36.0)
MCV: 85.1 fl (ref 78.0–100.0)
Platelets: 386 10*3/uL (ref 150.0–400.0)
RBC: 4.66 Mil/uL (ref 3.87–5.11)
RDW: 14.2 % (ref 11.5–15.5)
WBC: 10.2 10*3/uL (ref 4.0–10.5)

## 2021-11-28 LAB — VITAMIN D 25 HYDROXY (VIT D DEFICIENCY, FRACTURES): VITD: 21.05 ng/mL — ABNORMAL LOW (ref 30.00–100.00)

## 2021-11-28 LAB — TSH: TSH: 3.38 u[IU]/mL (ref 0.35–5.50)

## 2021-11-28 LAB — HEMOGLOBIN A1C: Hgb A1c MFr Bld: 5.8 % (ref 4.6–6.5)

## 2021-11-28 NOTE — Assessment & Plan Note (Addendum)
Exercise: 3x/week, cardio and weight training 45-69mins each. Diet: high protein and high fiber. No weight loss noted Wt Readings from Last 3 Encounters:  11/28/21 182 lb (82.6 kg)  08/17/20 173 lb (78.5 kg)  08/03/20 171 lb (77.6 kg)   She agreed to nutrition referral today

## 2021-11-28 NOTE — Patient Instructions (Addendum)
Go to lab for blood draw You will be contacted to schedule appt with nutritionist.  Schedule appt with GYN for PAP.  Calorie Counting for Weight Loss Calories are units of energy. Your body needs a certain number of calories from food to keep going throughout the day. When you eat or drink more calories than your body needs, your body stores the extra calories mostly as fat. When you eat or drink fewer calories than your body needs, your body burns fat to get the energy it needs. Calorie counting means keeping track of how many calories you eat and drink each day. Calorie counting can be helpful if you need to lose weight. If you eat fewer calories than your body needs, you should lose weight. Ask your health care provider what a healthy weight is for you. For calorie counting to work, you will need to eat the right number of calories each day to lose a healthy amount of weight per week. A dietitian can help you figure out how many calories you need in a day and will suggest ways to reach your calorie goal. A healthy amount of weight to lose each week is usually 1-2 lb (0.5-0.9 kg). This usually means that your daily calorie intake should be reduced by 500-750 calories. Eating 1,200-1,500 calories a day can help most women lose weight. Eating 1,500-1,800 calories a day can help most men lose weight. What do I need to know about calorie counting? Work with your health care provider or dietitian to determine how many calories you should get each day. To meet your daily calorie goal, you will need to: Find out how many calories are in each food that you would like to eat. Try to do this before you eat. Decide how much of the food you plan to eat. Keep a food log. Do this by writing down what you ate and how many calories it had. To successfully lose weight, it is important to balance calorie counting with a healthy lifestyle that includes regular activity. Where do I find calorie information? The  number of calories in a food can be found on a Nutrition Facts label. If a food does not have a Nutrition Facts label, try to look up the calories online or ask your dietitian for help. Remember that calories are listed per serving. If you choose to have more than one serving of a food, you will have to multiply the calories per serving by the number of servings you plan to eat. For example, the label on a package of bread might say that a serving size is 1 slice and that there are 90 calories in a serving. If you eat 1 slice, you will have eaten 90 calories. If you eat 2 slices, you will have eaten 180 calories. How do I keep a food log? After each time that you eat, record the following in your food log as soon as possible: What you ate. Be sure to include toppings, sauces, and other extras on the food. How much you ate. This can be measured in cups, ounces, or number of items. How many calories were in each food and drink. The total number of calories in the food you ate. Keep your food log near you, such as in a pocket-sized notebook or on an app or website on your mobile phone. Some programs will calculate calories for you and show you how many calories you have left to meet your daily goal. What are some portion-control tips?  Know how many calories are in a serving. This will help you know how many servings you can have of a certain food. Use a measuring cup to measure serving sizes. You could also try weighing out portions on a kitchen scale. With time, you will be able to estimate serving sizes for some foods. Take time to put servings of different foods on your favorite plates or in your favorite bowls and cups so you know what a serving looks like. Try not to eat straight from a food's packaging, such as from a bag or box. Eating straight from the package makes it hard to see how much you are eating and can lead to overeating. Put the amount you would like to eat in a cup or on a plate to make  sure you are eating the right portion. Use smaller plates, glasses, and bowls for smaller portions and to prevent overeating. Try not to multitask. For example, avoid watching TV or using your computer while eating. If it is time to eat, sit down at a table and enjoy your food. This will help you recognize when you are full. It will also help you be more mindful of what and how much you are eating. What are tips for following this plan? Reading food labels Check the calorie count compared with the serving size. The serving size may be smaller than what you are used to eating. Check the source of the calories. Try to choose foods that are high in protein, fiber, and vitamins, and low in saturated fat, trans fat, and sodium. Shopping Read nutrition labels while you shop. This will help you make healthy decisions about which foods to buy. Pay attention to nutrition labels for low-fat or fat-free foods. These foods sometimes have the same number of calories or more calories than the full-fat versions. They also often have added sugar, starch, or salt to make up for flavor that was removed with the fat. Make a grocery list of lower-calorie foods and stick to it. Cooking Try to cook your favorite foods in a healthier way. For example, try baking instead of frying. Use low-fat dairy products. Meal planning Use more fruits and vegetables. One-half of your plate should be fruits and vegetables. Include lean proteins, such as chicken, [place], and fish. Lifestyle Each week, aim to do one of the following: 150 minutes of moderate exercise, such as walking. 75 minutes of vigorous exercise, such as running. General information Know how many calories are in the foods you eat most often. This will help you calculate calorie counts faster. Find a way of tracking calories that works for you. Get creative. Try different apps or programs if writing down calories does not work for you. What foods should I  eat?  Eat nutritious foods. It is better to have a nutritious, high-calorie food, such as an avocado, than a food with few nutrients, such as a bag of potato chips. Use your calories on foods and drinks that will fill you up and will not leave you hungry soon after eating. Examples of foods that fill you up are nuts and nut butters, vegetables, lean proteins, and high-fiber foods such as whole grains. High-fiber foods are foods with more than 5 g of fiber per serving. Pay attention to calories in drinks. Low-calorie drinks include water and unsweetened drinks. The items listed above may not be a complete list of foods and beverages you can eat. Contact a dietitian for more information. What foods should I limit?  Limit foods or drinks that are not good sources of vitamins, minerals, or protein or that are high in unhealthy fats. These include: Candy. Other sweets. Sodas, specialty coffee drinks, alcohol, and juice. The items listed above may not be a complete list of foods and beverages you should avoid. Contact a dietitian for more information. How do I count calories when eating out? Pay attention to portions. Often, portions are much larger when eating out. Try these tips to keep portions smaller: Consider sharing a meal instead of getting your own. If you get your own meal, eat only half of it. Before you start eating, ask for a container and put half of your meal into it. When available, consider ordering smaller portions from the menu instead of full portions. Pay attention to your food and drink choices. Knowing the way food is cooked and what is included with the meal can help you eat fewer calories. If calories are listed on the menu, choose the lower-calorie options. Choose dishes that include vegetables, fruits, whole grains, low-fat dairy products, and lean proteins. Choose items that are boiled, broiled, grilled, or steamed. Avoid items that are buttered, battered, fried, or served  with cream sauce. Items labeled as crispy are usually fried, unless stated otherwise. Choose water, low-fat milk, unsweetened iced tea, or other drinks without added sugar. If you want an alcoholic beverage, choose a lower-calorie option, such as a glass of wine or light beer. Ask for dressings, sauces, and syrups on the side. These are usually high in calories, so you should limit the amount you eat. If you want a salad, choose a garden salad and ask for grilled meats. Avoid extra toppings such as bacon, cheese, or fried items. Ask for the dressing on the side, or ask for olive oil and vinegar or lemon to use as dressing. Estimate how many servings of a food you are given. Knowing serving sizes will help you be aware of how much food you are eating at restaurants. Where to find more information Centers for Disease Control and Prevention: FootballExhibition.com.br U.[place]. Department of Agriculture: WrestlingReporter.dk Summary Calorie counting means keeping track of how many calories you eat and drink each day. If you eat fewer calories than your body needs, you should lose weight. A healthy amount of weight to lose per week is usually 1-2 lb (0.5-0.9 kg). This usually means reducing your daily calorie intake by 500-750 calories. The number of calories in a food can be found on a Nutrition Facts label. If a food does not have a Nutrition Facts label, try to look up the calories online or ask your dietitian for help. Use smaller plates, glasses, and bowls for smaller portions and to prevent overeating. Use your calories on foods and drinks that will fill you up and not leave you hungry shortly after a meal. This information is not intended to replace advice given to you by your health care provider. Make sure you discuss any questions you have with your health care provider. Document Revised: 11/18/2019 Document Reviewed: 11/18/2019 Elsevier Patient Education  2022 [employer].

## 2021-11-28 NOTE — Assessment & Plan Note (Signed)
Unable to tolerate metformin: GU side effects (nausea and ABD pain) She will like to try ozempic injection  Check HgbA1c today

## 2021-11-28 NOTE — Assessment & Plan Note (Signed)
Resolved with psychotherapy. Did not need cymbalta rx

## 2021-11-28 NOTE — Assessment & Plan Note (Signed)
Repeat persistent fatigue despite adequate dleep, exercise and heart healthy diet.  Check. Cbc, bmp, tsh, vit. D and hgbA1c

## 2021-11-28 NOTE — Progress Notes (Signed)
Subjective:  Patient ID: Tamara Weaver, female    DOB: 05-10-1991  Age: 31 y.o. MRN: [id number]  CC: Follow-up (F/u on fatigue, pt stopped metformin 6 months ago and states over the past month she has noticed she has been feeling more fatigued. )  HPI   Obesity (BMI 30.0-34.9) Exercise: 3x/week, cardio and weight training 45-66mins each. Diet: high protein and high fiber. No weight loss noted Wt Readings from Last 3 Encounters:  11/28/21 182 lb (82.6 kg)  08/17/20 173 lb (78.5 kg)  08/03/20 171 lb (77.6 kg)   She agreed to nutrition referral today  Prediabetes Unable to tolerate metformin: GU side effects (nausea and ABD pain) She will like to try ozempic injection  Check HgbA1c today  Vitamin D deficiency Repeat persistent fatigue despite adequate dleep, exercise and heart healthy diet.  Check. Cbc, bmp, tsh, vit. D and hgbA1c  GAD (generalized anxiety disorder) Resolved with psychotherapy. Did not need cymbalta rx   Reviewed past Medical, Social and Family history today.  Outpatient Medications Prior to Visit  Medication Sig Dispense Refill   Ascorbic Acid (VITAMIN C) 1000 MG tablet Take 1,000 mg by mouth daily.     fluticasone (FLONASE) 50 MCG/ACT nasal spray Place 1 spray into both nostrils daily. 16 g 2   Multiple Vitamins-Minerals (VITAMIN D3 COMPLETE) TABS Take by mouth.     Vitamin D, Ergocalciferol, (DRISDOL) 1.25 MG (50000 UNIT) CAPS capsule Take 1 capsule (50,000 Units total) by mouth every 7 (seven) days. 4 capsule 0   amoxicillin-clavulanate (AUGMENTIN) 875-125 MG tablet Take 1 tablet by mouth 2 (two) times daily. (Patient not taking: Reported on 11/28/2021) 28 tablet 0   DULoxetine (CYMBALTA) 20 MG capsule Take 1 capsule (20 mg total) by mouth daily. (Patient not taking: Reported on 11/28/2021) 30 capsule 2   fluconazole (DIFLUCAN) 150 MG tablet Take 1 tablet (150 mg total) by mouth daily. (Patient not taking: Reported on 11/28/2021) 1 tablet 1   fluconazole  (DIFLUCAN) 150 MG tablet Take 1 tablet (150 mg total) by mouth daily. (Patient not taking: Reported on 11/28/2021) 1 tablet 1   metFORMIN (GLUCOPHAGE) 500 MG tablet Take 1 tablet (500 mg total) by mouth daily with supper. (Patient not taking: Reported on 11/28/2021) 30 tablet 0   Multiple Vitamin (MULTI-VITAMIN) tablet Take 1 tablet by mouth daily. (Patient not taking: Reported on 11/28/2021)     predniSONE (STERAPRED UNI-PAK 21 TAB) 10 MG (21) TBPK tablet Take by mouth daily. Take 6 tabs by mouth daily  for 2 days, then 5 tabs for 2 days, then 4 tabs for 2 days, then 3 tabs for 2 days, 2 tabs for 2 days, then 1 tab by mouth daily for 2 days (Patient not taking: Reported on 11/28/2021) 42 tablet 0   No facility-administered medications prior to visit.    ROS See HPI  Objective:  BP 120/82 (BP Location: Left Arm, Patient Position: Sitting, Cuff Size: Large)    Pulse (!) 101    Temp 97.7 F (36.5 C) (Temporal)    Ht 5\' 1"  (1.549 m)    Wt 182 lb (82.6 kg)    SpO2 99%    BMI 34.39 kg/m   Physical Exam Constitutional:      Appearance: She is obese.  Cardiovascular:     Rate and Rhythm: Normal rate.     Pulses: Normal pulses.  Pulmonary:     Effort: Pulmonary effort is normal.  Neurological:     Mental Status: She is  alert and oriented to person, place, and time.  Psychiatric:        Mood and Affect: Mood normal.        Behavior: Behavior normal.        Thought Content: Thought content normal.    Assessment & Plan:  This visit occurred during the SARS-CoV-2 public health emergency.  Safety protocols were in place, including screening questions prior to the visit, additional usage of staff PPE, and extensive cleaning of exam room while observing appropriate contact time as indicated for disinfecting solutions.   Tamara Weaver was seen today for follow-up.  Diagnoses and all orders for this visit:  Vitamin D deficiency -     Vitamin D (25 hydroxy)  Prediabetes -     Hemoglobin A1c -     Referral  to Nutrition and Diabetes Services  GAD (generalized anxiety disorder)  Class 1 obesity due to excess calories with serious comorbidity and body mass index (BMI) of 34.0 to 34.9 in adult -     CBC -     Basic metabolic panel -     TSH -     Hemoglobin A1c -     Referral to Nutrition and Diabetes Services -     Lipid panel   Problem List Items Addressed This Visit       Other   GAD (generalized anxiety disorder)    Resolved with psychotherapy. Did not need cymbalta rx      Obesity (BMI 30.0-34.9)    Exercise: 3x/week, cardio and weight training 45-47mins each. Diet: high protein and high fiber. No weight loss noted Wt Readings from Last 3 Encounters:  11/28/21 182 lb (82.6 kg)  08/17/20 173 lb (78.5 kg)  08/03/20 171 lb (77.6 kg)   She agreed to nutrition referral today      Prediabetes    Unable to tolerate metformin: GU side effects (nausea and ABD pain) She will like to try ozempic injection  Check HgbA1c today      Relevant Orders   Hemoglobin A1c   Referral to Nutrition and Diabetes Services   Vitamin D deficiency - Primary    Repeat persistent fatigue despite adequate dleep, exercise and heart healthy diet.  Check. Cbc, bmp, tsh, vit. D and hgbA1c      Relevant Orders   Vitamin D (25 hydroxy)    Follow-up: Return in about 3 months (around 02/25/2022) for CPE (fasting).  Alysia Penna, NP

## 2021-11-29 ENCOUNTER — Encounter: Payer: Medicaid Other | Attending: Nurse Practitioner | Admitting: Dietician

## 2021-11-29 ENCOUNTER — Telehealth: Payer: Self-pay | Admitting: Nurse Practitioner

## 2021-11-29 ENCOUNTER — Other Ambulatory Visit: Payer: Self-pay

## 2021-11-29 ENCOUNTER — Encounter: Payer: Self-pay | Admitting: Dietician

## 2021-11-29 DIAGNOSIS — Z713 Dietary counseling and surveillance: Secondary | ICD-10-CM | POA: Insufficient documentation

## 2021-11-29 DIAGNOSIS — E6609 Other obesity due to excess calories: Secondary | ICD-10-CM | POA: Diagnosis not present

## 2021-11-29 DIAGNOSIS — R7303 Prediabetes: Secondary | ICD-10-CM | POA: Insufficient documentation

## 2021-11-29 DIAGNOSIS — Z6834 Body mass index (BMI) 34.0-34.9, adult: Secondary | ICD-10-CM | POA: Insufficient documentation

## 2021-11-29 DIAGNOSIS — E669 Obesity, unspecified: Secondary | ICD-10-CM

## 2021-11-29 NOTE — Patient Instructions (Addendum)
Exercise.  Aim for 30 minutes or more most days. Mindfulness  Eat slowly When am I full?  Mindful choices  Are you eating the sweet at night because your are hungry or for another reason?  What can you do instead?  Eat dinner earlier Quality sleep Drink more water.  Eat more Non-Starchy Vegetables These include greens, broccoli, cauliflower, cabbage, carrots, beets, eggplant, peppers, squash and others. Minimize added sugars and refined grains Rethink what you drink.  Choose beverages without added sugar.  Look for 0 carbs on the label. See the list of whole grains below.  Find alternatives to usual sweet treats. Choose whole foods over processed. Make simple meals at home more often than eating out. Increase fiber  Tips to increase fiber in your diet: (All plants have fiber.  Eat a variety. There are more than are on this list.) Slowly increase the amount of fiber you eat to 25-35 grams per day.  (More is fine if you tolerate it.) Fiber from whole grains, nuts and seeds Quinoa, 1/2 cup = 5 grams Bulgur, 1/2 cup = 4.1 grams Popcorn, 3 cups = 3.6 grams Whole Wheat Spaghetti, 1/2 cup = 3.2 grams Barley, 1/2 cup = 3 grams Oatmeal, 1/2 cup = 2 grams Whole Wheat English Muffin = 3 grams Corn, 1/2 cup = 2.1 grams Brown Rice, 1/2 cup = 1.8 grams Flax seeds, 1 Tablespoon = 2.8 grams Chia seeds, 1 Tablespoon = 11 grams Almonds, 1 ounce = 3.5 grams fiber Fiber from legumes Kidney beans, 1/2 cup 7.9 grams Lentils, 1/2 cup = 7.8 grams Pinto beans, 1/2 cup = 7.7 grams Black beans, 1/2 cup = 7.6 grams Lima beans, 1/2 cup 6.4 grams Chick peas, 1/2 cup = 5.3 grams Black eyed peas, 1/2 cup = 4 grams Fiber from fruits and vegetables Pear, 6 grams Apple. 3.3 grams Raspberries or Blackberries, 3/4 cup = 6 grams Strawberries or Blueberries, 1 cup = 3.4 grams Baked sweet potato 3.8 grams fiber Baked potato with skin 4.4 grams  Peas, 1/2 cup = 4.4 grams  Spinach, 1/2 cup cooked = 3.5  grams  Avocado, 1/2 = 5 grams

## 2021-11-29 NOTE — Telephone Encounter (Signed)
Pt called asking about her lab results and whether or not she would get a phonecall.

## 2021-11-29 NOTE — Progress Notes (Addendum)
Medical Nutrition Therapy  Appointment Start time:  [phone]  Appointment End time:  1040  Primary concerns today: Patient would like to lose weight and reverse prediabetes.  Referral diagnosis: prediabetes, obesity Preferred learning style: no preference indicated Learning readiness: ready   NUTRITION ASSESSMENT   Anthropometrics  61" 181 lbs 11/29/2021 145 lbs lowest adult weight after first pregnancy   Clinical Medical Hx: asthma, prediabetes Medications: MVI, vitamin D.  Patient states that she had GI problems with Metformin.  Her mother takes Ozempic and patient has discussed with her MD and is under consideration. Labs: 11/28/2021 fasting:  A1C 5.8%, GFR 88, Glucose 104, Cholesterol 207, HDL 58, LDL 121, Triglycerides 137, vitamin D 21 Notable Signs/Symptoms: fatigue  Lifestyle & Dietary Hx Patient lives with her husband and 2 children (7 and 53 yo).  She has a JD degree and was just sworn in 07/2029 and is currently looking for work.  Estimated daily fluid intake: 30-80 oz water per day plus sweet tea and lemonade Sleep: insomnia vs increased fatigue yesterday, usually 4-5 hours per night Stress / self-care: improved Current average weekly physical activity: just moved into a new place and has not recently and was going to the gym 2 times per week for 45-60 minutes.  24-Hr Dietary Recall Allergy:  Tomatoes (can tolerate if cooked) First Meal: skips Snack: none Second Meal: leftovers  Snack: none or rare salt and vinegar cashews or beef jerky Third Meal: tacos OR BBQ chicken, mac and cheese, green beans OR tilapia, baked beans OR sloppy jo's on a bun with baked beans OR boiled chicken, brown rice or flavored rice, green beans OR baked pork chops, green beans Snack: after dinner occasional chocolate (1-2 miniature candy bars) OR moose tracks ice cream (1 1/2 cups) Beverages: water, sweet tea, lemonade, selzer water  Estimated Energy Needs Calories: 1200 for weight loss Protein:  55-65 g  NUTRITION DIAGNOSIS  NB-1.1 Food and nutrition-related knowledge deficit As related to balance of carbohydrate, protein and fat.  As evidenced by diet hx and patient report.  NUTRITION INTERVENTION  Nutrition education (E-1) on the following topics:  A1C and diagnosis of prediabetes Insulin resistance and implication on blood glucose Tips to improve insulin resistance such as weight loss, metformin, exercise, low saturated fat Discussed sources of carbohydrates and portions Discussed benefits of increased fiber Recommended protein with meals Recommended consistent meals Mindfulness Beverage choices, adequate hydration Importance of exercise and goals Sleep importance and tips to improve  Handouts Provided Include  My plate Label reading Meal plan card Tips to increase fiber Diabetes resource page  Learning Style & Readiness for Change Teaching method utilized: Visual & Auditory  Demonstrated degree of understanding via: Teach Back  Barriers to learning/adherence to lifestyle change: none  Goals Exercise.  Aim for 30 minutes or more most days. Mindfulness  Eat slowly When am I full?  Mindful choices  Are you eating the sweet at night because your are hungry or for another reason?  What can you do instead?  Eat dinner earlier Quality sleep Drink more water.  Eat more Non-Starchy Vegetables These include greens, broccoli, cauliflower, cabbage, carrots, beets, eggplant, peppers, squash and others. Minimize added sugars and refined grains Rethink what you drink.  Choose beverages without added sugar.  Look for 0 carbs on the label. See the list of whole grains below.  Find alternatives to usual sweet treats. Choose whole foods over processed. Make simple meals at home more often than eating out. Increase fiber  MONITORING & EVALUATION Dietary intake, weekly physical activity, and label reading prn.  Next Steps  Patient is to call for questions.

## 2021-11-30 ENCOUNTER — Encounter: Payer: Self-pay | Admitting: Nurse Practitioner

## 2021-11-30 NOTE — Telephone Encounter (Signed)
Awaiting results from provider.

## 2021-12-01 MED ORDER — VITAMIN D (ERGOCALCIFEROL) 1.25 MG (50000 UNIT) PO CAPS: 50000.0000 [IU] | ORAL_CAPSULE | ORAL | 0 refills | Status: DC

## 2021-12-01 NOTE — Addendum Note (Signed)
Addended by: Alysia Penna L on: 12/01/2021 10:56 PM   Modules accepted: Orders

## 2021-12-07 ENCOUNTER — Ambulatory Visit: Payer: Medicaid Other | Admitting: Nurse Practitioner

## 2021-12-07 ENCOUNTER — Encounter: Payer: Self-pay | Admitting: Nurse Practitioner

## 2021-12-07 ENCOUNTER — Other Ambulatory Visit: Payer: Self-pay

## 2021-12-07 VITALS — BP 110/64 | HR 110 | Temp 97.9°F | Ht 61.0 in | Wt 181.6 lb

## 2021-12-07 DIAGNOSIS — E669 Obesity, unspecified: Secondary | ICD-10-CM

## 2021-12-07 DIAGNOSIS — R7303 Prediabetes: Secondary | ICD-10-CM

## 2021-12-07 MED ORDER — OZEMPIC (0.25 OR 0.5 MG/DOSE) 2 MG/1.5ML ~~LOC~~ SOPN: PEN_INJECTOR | SUBCUTANEOUS | 0 refills | Status: DC

## 2021-12-07 NOTE — Assessment & Plan Note (Signed)
1lb weight loss in lat 2weeks. Had appt with nutritionist/ plans to work on meal portions. Lowest adult weight 140lbs 82yrs ago Wt Readings from Last 3 Encounters:  12/07/21 181 lb 9.6 oz (82.4 kg)  11/29/21 181 lb (82.1 kg)  11/28/21 182 lb (82.6 kg)

## 2021-12-07 NOTE — Progress Notes (Signed)
Subjective:  Tamara Weaver ID: Tamara Weaver, female    DOB: [birth year]  Age: 31 y.o. MRN: [id number]  CC: Follow-up (Pt unable to tolerate metformin and would like to discuss possibly starting the medication ozempic. Pt would also like to discuss vitamin d levels and ways to improve this. )  HPI Prediabetes reports session with nutritionist was helpful, but Tamara Weaver will still like to start ozempic injection. Tamara Weaver is aware of the possible side effects.  ozempic rx sent with titration instructions. F/up in 1-58months  Obesity (BMI 30.0-34.9) 1lb weight loss in lat 2weeks. Had appt with nutritionist/ plans to work on meal portions. Lowest adult weight 140lbs 57yrs ago Wt Readings from Last 3 Encounters:  12/07/21 181 lb 9.6 oz (82.4 kg)  11/29/21 181 lb (82.1 kg)  11/28/21 182 lb (82.6 kg)    Reviewed past Medical, Social and Family history today.  Outpatient Medications Prior to Visit  Medication Sig Dispense Refill   Ascorbic Acid (VITAMIN C) 1000 MG tablet Take 1,000 mg by mouth daily.     fluticasone (FLONASE) 50 MCG/ACT nasal spray Place 1 spray into both nostrils daily. 16 g 2   Multiple Vitamins-Minerals (VITAMIN D3 COMPLETE) TABS Take by mouth.     Vitamin D, Ergocalciferol, (DRISDOL) 1.25 MG (50000 UNIT) CAPS capsule Take 1 capsule (50,000 Units total) by mouth every 7 (seven) days. 8 capsule 0   No facility-administered medications prior to visit.    ROS See HPI  Objective:  BP 110/64 (BP Location: Left Arm, Tamara Weaver Position: Sitting, Cuff Size: Normal)    Pulse (!) 110    Temp 97.9 F (36.6 C) (Temporal)    Ht 5\' 1"  (1.549 m)    Wt 181 lb 9.6 oz (82.4 kg)    SpO2 99%    BMI 34.31 kg/m   Physical Exam Neurological:     Mental Status: Tamara Weaver is alert and oriented to person, place, and time.  Psychiatric:        Mood and Affect: Mood normal.        Behavior: Behavior normal.        Thought Content: Thought content normal.    Assessment & Plan:  This visit occurred during  the SARS-CoV-2 public health emergency.  Safety protocols were in place, including screening questions prior to the visit, additional usage of staff PPE, and extensive cleaning of exam room while observing appropriate contact time as indicated for disinfecting solutions.   Tamara Weaver was seen today for follow-up.  Diagnoses and all orders for this visit:  Prediabetes -     Semaglutide,0.25 or 0.5MG /DOS, (OZEMPIC, 0.25 OR 0.5 MG/DOSE,) 2 MG/1.5ML SOPN; 0.25mg  once a week x2weeks then increase 0.5mg  weekly continuously  Obesity (BMI 30.0-34.9)   Problem List Items Addressed This Visit       Other   Obesity (BMI 30.0-34.9)    1lb weight loss in lat 2weeks. Had appt with nutritionist/ plans to work on meal portions. Lowest adult weight 140lbs 55yrs ago Wt Readings from Last 3 Encounters:  12/07/21 181 lb 9.6 oz (82.4 kg)  11/29/21 181 lb (82.1 kg)  11/28/21 182 lb (82.6 kg)        Prediabetes - Primary    reports session with nutritionist was helpful, but Tamara Weaver will still like to start ozempic injection. Tamara Weaver is aware of the possible side effects.  ozempic rx sent with titration instructions. F/up in 1-78months      Relevant Medications   Semaglutide,0.25 or 0.5MG /DOS, (OZEMPIC, 0.25 OR 0.5 MG/DOSE,)  2 MG/1.5ML SOPN    Follow-up: Return in about 3 months (around 03/06/2022) for maintain upcoming appt in May.  Alysia Penna, NP

## 2021-12-07 NOTE — Assessment & Plan Note (Signed)
reports session with nutritionist was helpful, but she will still like to start ozempic injection. She is aware of the possible side effects.  ozempic rx sent with titration instructions. F/up in 1-65months

## 2021-12-07 NOTE — Patient Instructions (Addendum)
Start ozempic as discussed If not approved by insurance, swill switch to saxenda  Semaglutide Injection What is this medication? SEMAGLUTIDE (SEM a GLOO tide) treats type 2 diabetes. It works by increasing insulin levels in your body, which decreases your blood sugar (glucose). It also reduces the amount of sugar released into the blood and slows down your digestion. It can also be used to lower the risk of heart attack and stroke in people with type 2 diabetes. Changes to diet and exercise are often combined with this medication. This medicine may be used for other purposes; ask your health care provider or pharmacist if you have questions. COMMON BRAND NAME(S): OZEMPIC What should I tell my care team before I take this medication? They need to know if you have any of these conditions: Endocrine tumors (MEN 2) or if someone in your family had these tumors Eye disease, vision problems History of pancreatitis Kidney disease Stomach problems Thyroid cancer or if someone in your family had thyroid cancer An unusual or allergic reaction to semaglutide, other medications, foods, dyes, or preservatives Pregnant or trying to get pregnant Breast-feeding How should I use this medication? This medication is for injection under the skin of your upper leg (thigh), stomach area, or upper arm. It is given once every week (every 7 days). You will be taught how to prepare and give this medication. Use exactly as directed. Take your medication at regular intervals. Do not take it more often than directed. If you use this medication with insulin, you should inject this medication and the insulin separately. Do not mix them together. Do not give the injections right next to each other. Change (rotate) injection sites with each injection. It is important that you put your used needles and syringes in a special sharps container. Do not put them in a trash can. If you do not have a sharps container, call your  pharmacist or care team to get one. A special MedGuide will be given to you by the pharmacist with each prescription and refill. Be sure to read this information carefully each time. This medication comes with INSTRUCTIONS FOR USE. Ask your pharmacist for directions on how to use this medication. Read the information carefully. Talk to your pharmacist or care team if you have questions. Talk to your care team about the use of this medication in children. Special care may be needed. Overdosage: If you think you have taken too much of this medicine contact a poison control center or emergency room at once. NOTE: This medicine is only for you. Do not share this medicine with others. What if I miss a dose? If you miss a dose, take it as soon as you can within 5 days after the missed dose. Then take your next dose at your regular weekly time. If it has been longer than 5 days after the missed dose, do not take the missed dose. Take the next dose at your regular time. Do not take double or extra doses. If you have questions about a missed dose, contact your care team for advice. What may interact with this medication? Other medications for diabetes Many medications may cause changes in blood sugar, these include: Alcohol containing beverages Antiviral medications for HIV or AIDS Aspirin and aspirin-like medications Certain medications for blood pressure, heart disease, irregular heart beat Chromium Diuretics Female hormones, such as estrogens or progestins, birth control pills Fenofibrate Gemfibrozil Isoniazid Lanreotide Female hormones or anabolic steroids MAOIs like Carbex, Eldepryl, Marplan, Nardil, and Parnate  Medications for weight loss Medications for allergies, asthma, cold, or cough Medications for depression, anxiety, or psychotic disturbances Niacin Nicotine NSAIDs, medications for pain and inflammation, like ibuprofen or  naproxen Octreotide Pasireotide Pentamidine Phenytoin Probenecid Quinolone antibiotics such as ciprofloxacin, levofloxacin, ofloxacin Some herbal dietary supplements Steroid medications such as prednisone or cortisone Sulfamethoxazole; trimethoprim Thyroid hormones Some medications can hide the warning symptoms of low blood sugar (hypoglycemia). You may need to monitor your blood sugar more closely if you are taking one of these medications. These include: Beta-blockers, often used for high blood pressure or heart problems (examples include atenolol, metoprolol, propranolol) Clonidine Guanethidine Reserpine This list may not describe all possible interactions. Give your health care provider a list of all the medicines, herbs, non-prescription drugs, or dietary supplements you use. Also tell them if you smoke, drink alcohol, or use illegal drugs. Some items may interact with your medicine. What should I watch for while using this medication? Visit your care team for regular checks on your progress. Drink plenty of fluids while taking this medication. Check with your care team if you get an attack of severe diarrhea, nausea, and vomiting. The loss of too much body fluid can make it dangerous for you to take this medication. A test called the HbA1C (A1C) will be monitored. This is a simple blood test. It measures your blood sugar control over the last 2 to 3 months. You will receive this test every 3 to 6 months. Learn how to check your blood sugar. Learn the symptoms of low and high blood sugar and how to manage them. Always carry a quick-source of sugar with you in case you have symptoms of low blood sugar. Examples include hard sugar candy or glucose tablets. Make sure others know that you can choke if you eat or drink when you develop serious symptoms of low blood sugar, such as seizures or unconsciousness. They must get medical help at once. Tell your care team if you have high blood sugar.  You might need to change the dose of your medication. If you are sick or exercising more than usual, you might need to change the dose of your medication. Do not skip meals. Ask your care team if you should avoid alcohol. Many nonprescription cough and cold products contain sugar or alcohol. These can affect blood sugar. Pens should never be shared. Even if the needle is changed, sharing may result in passing of viruses like hepatitis or HIV. Wear a medical ID bracelet or chain, and carry a card that describes your disease and details of your medication and dosage times. Do not become pregnant while taking this medication. Women should inform their care team if they wish to become pregnant or think they might be pregnant. There is a potential for serious side effects to an unborn child. Talk to your care team for more information. What side effects may I notice from receiving this medication? Side effects that you should report to your care team as soon as possible: Allergic reactions--skin rash, itching, hives, swelling of the face, lips, tongue, or throat Change in vision Dehydration--increased thirst, dry mouth, feeling faint or lightheaded, headache, dark yellow or brown urine Gallbladder problems--severe stomach pain, nausea, vomiting, fever Heart palpitations--rapid, pounding, or irregular heartbeat Kidney injury--decrease in the amount of urine, swelling of the ankles, hands, or feet Pancreatitis--severe stomach pain that spreads to your back or gets worse after eating or when touched, fever, nausea, vomiting Thyroid cancer--new mass or lump in the neck, pain  or trouble swallowing, trouble breathing, hoarseness Side effects that usually do not require medical attention (report to your care team if they continue or are bothersome): Diarrhea Loss of appetite Nausea Stomach pain Vomiting This list may not describe all possible side effects. Call your doctor for medical advice about side  effects. You may report side effects to FDA at 1-800-FDA-1088. Where should I keep my medication? Keep out of the reach of children. Store unopened pens in a refrigerator between 2 and 8 degrees C (36 and 46 degrees F). Do not freeze. Protect from light and heat. After you first use the pen, it can be stored for 56 days at room temperature between 15 and 30 degrees C (59 and 86 degrees F) or in a refrigerator. Throw away your used pen after 56 days or after the expiration date, whichever comes first. Do not store your pen with the needle attached. If the needle is left on, medication may leak from the pen. NOTE: This sheet is a summary. It may not cover all possible information. If you have questions about this medicine, talk to your doctor, pharmacist, or health care provider.  2022 Elsevier/Gold Standard (2021-01-11 00:00:00)

## 2021-12-17 ENCOUNTER — Telehealth: Payer: Self-pay

## 2021-12-17 NOTE — Telephone Encounter (Signed)
PA approval for Ozempic.

## 2021-12-28 IMAGING — CR DG CHEST 2V
2 series · 2 of 2 positions shown · non-contrast
Comparison: None.

CLINICAL DATA: Chest pain, short of breath

EXAM:
CHEST - 2 VIEW

[w chest pa]
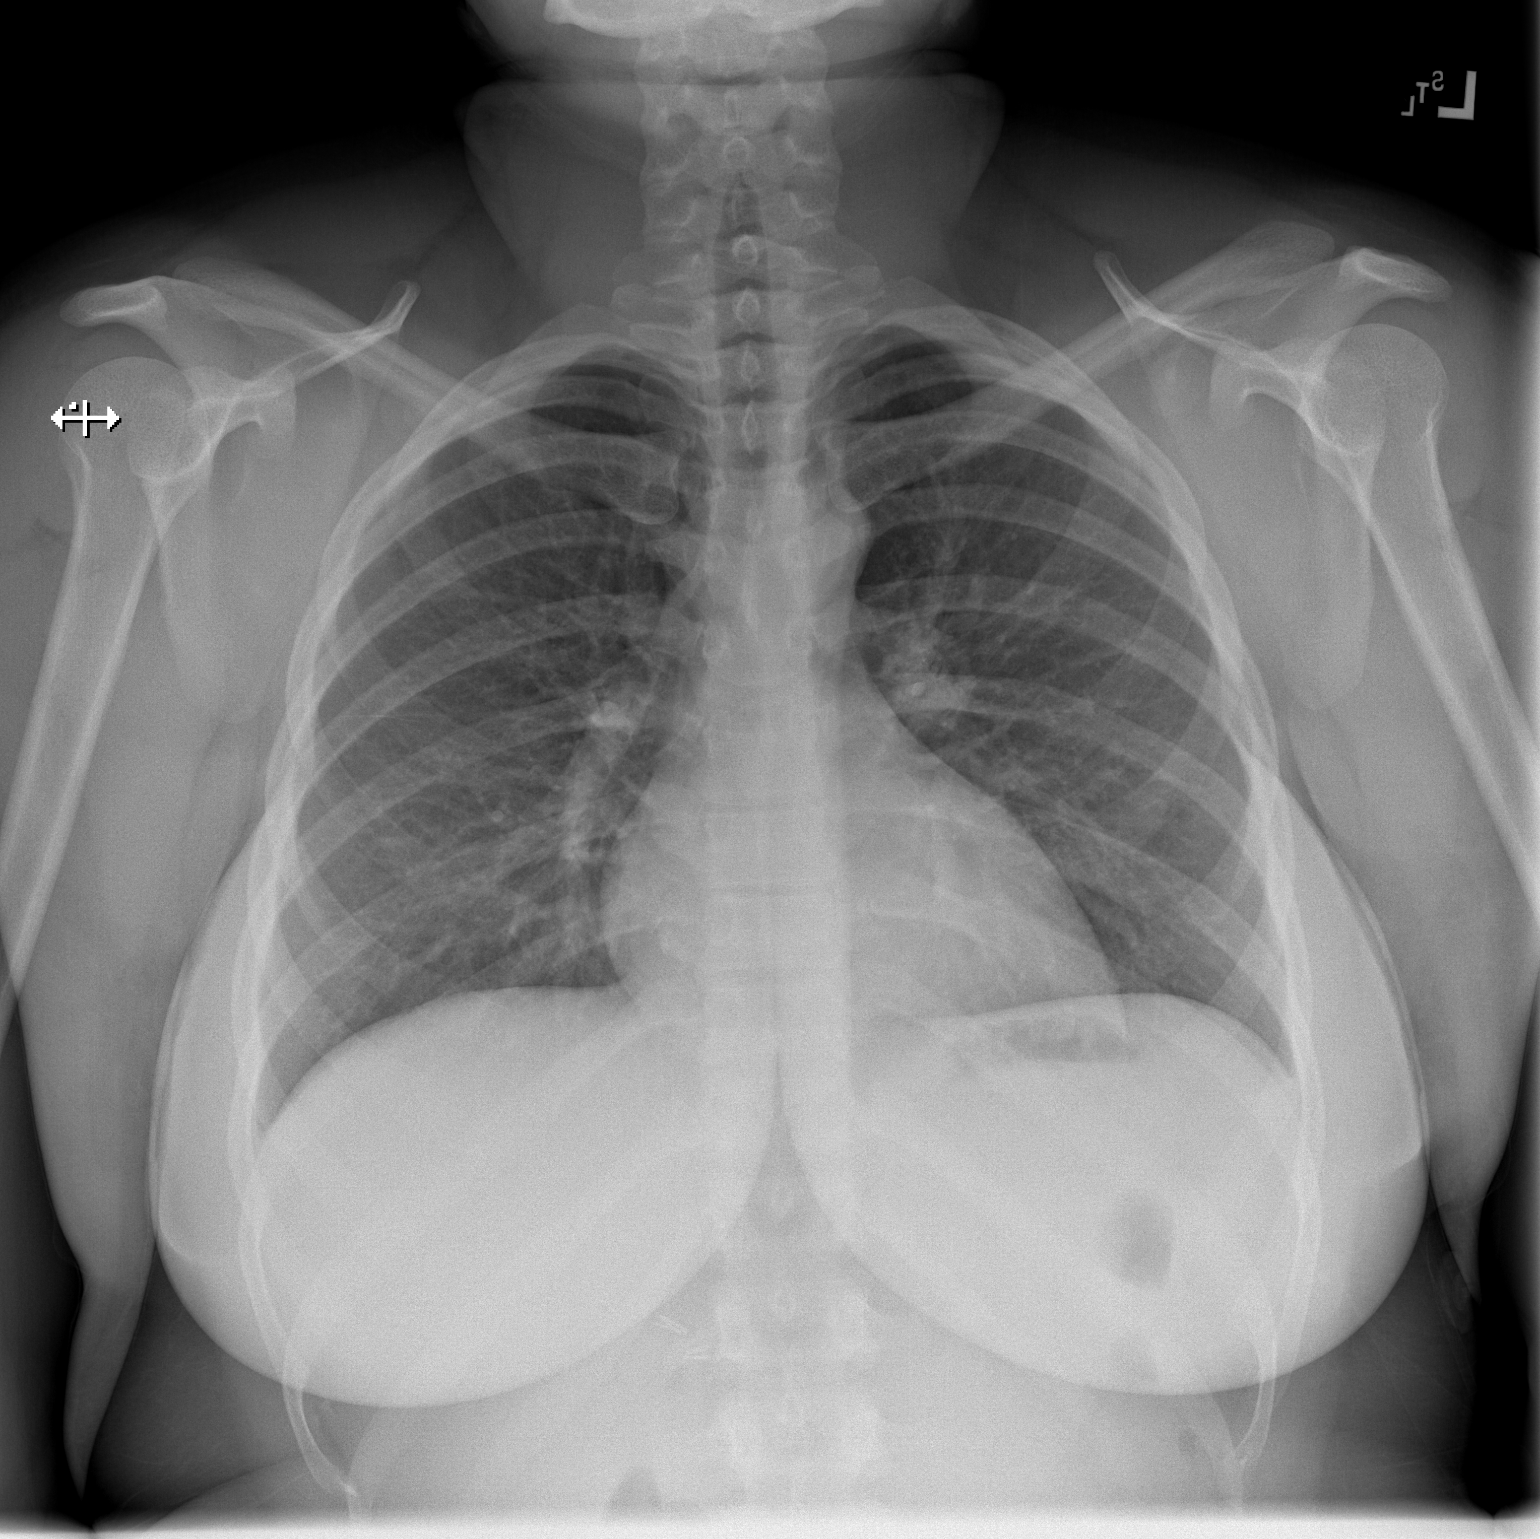

[w chest lat]
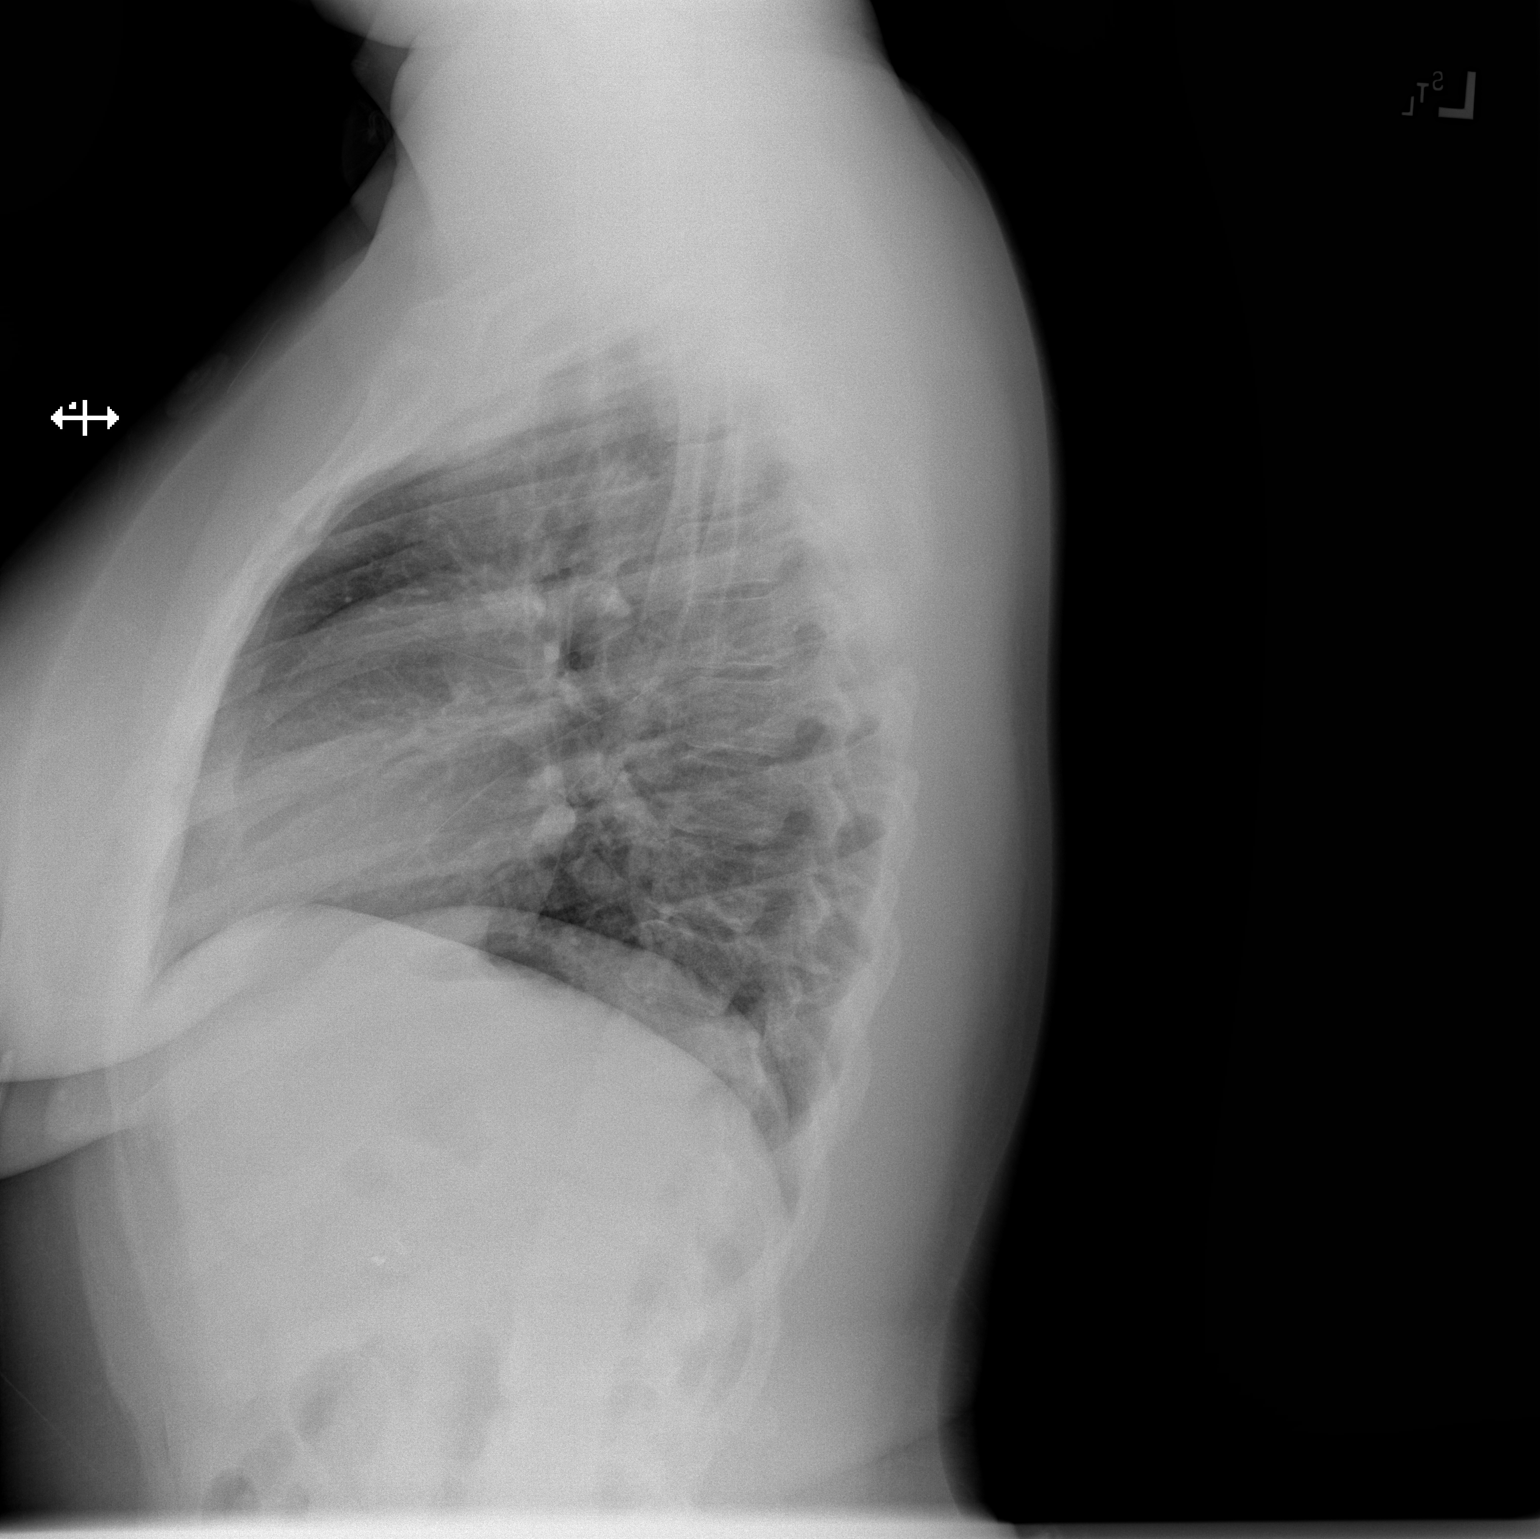

[2 of 2 positions shown; findings below may reference images not displayed]

FINDINGS: The heart size and mediastinal contours are within normal limits.
Both lungs are clear. The visualized skeletal structures are
unremarkable.
IMPRESSION: No active cardiopulmonary disease.

## 2022-01-11 ENCOUNTER — Ambulatory Visit: Payer: Medicaid Other | Admitting: Nurse Practitioner

## 2022-01-22 ENCOUNTER — Ambulatory Visit (INDEPENDENT_AMBULATORY_CARE_PROVIDER_SITE_OTHER): Payer: Medicaid Other | Admitting: Advanced Practice Midwife

## 2022-01-22 ENCOUNTER — Other Ambulatory Visit (HOSPITAL_COMMUNITY)
Admission: RE | Admit: 2022-01-22 | Discharge: 2022-01-22 | Disposition: A | Discharge status: Home / Self Care | Payer: Medicaid Other | Source: Ambulatory Visit | Attending: Advanced Practice Midwife | Admitting: Advanced Practice Midwife

## 2022-01-22 ENCOUNTER — Encounter: Payer: Self-pay | Admitting: Advanced Practice Midwife

## 2022-01-22 VITALS — BP 129/81 | HR 104 | Ht 61.0 in | Wt 175.0 lb

## 2022-01-22 DIAGNOSIS — Z01419 Encounter for gynecological examination (general) (routine) without abnormal findings: Secondary | ICD-10-CM | POA: Insufficient documentation

## 2022-01-22 DIAGNOSIS — R7303 Prediabetes: Secondary | ICD-10-CM

## 2022-01-22 DIAGNOSIS — E049 Nontoxic goiter, unspecified: Secondary | ICD-10-CM

## 2022-01-22 NOTE — Progress Notes (Signed)
GYNECOLOGY ANNUAL PREVENTATIVE CARE ENCOUNTER NOTE ? ?Subjective:  ? Tamara Weaver is a 31 y.o. G6P2002 female here for a routine annual gynecologic exam.  Current complaints: none.  Sees Family Medicine NP for her general healthcare and management of her pre-diabetes.  Just started Ozempic due to intolerance of metformin. .   Denies abnormal vaginal bleeding, discharge, pelvic pain, problems with intercourse or other gynecologic concerns.  ?  ?Gynecologic History ?Patient's last menstrual period was 12/28/2021 (exact date). ?Contraception: tubal ligation ?Last Pap: unsure. Results were: normal ? ? ?Obstetric History ?OB History  ?Gravida Para Term Preterm AB Living  ?2 2 2     2   ?SAB IAB Ectopic Multiple Live Births  ?        2  ?  ?# Outcome Date GA Lbr Len/2nd Weight Sex Delivery Anes PTL Lv  ?2 Term 2017 [redacted]w[redacted]d   F Vag-Spont EPI N LIV  ?1 Term 45 [redacted]w[redacted]d   M Vag-Spont None N LIV  ? ? ?Past Medical History:  ?Diagnosis Date  ? Anxiety   ? Asthma   ? Depression   ? Food allergy   ? Gallbladder problem   ? Lactose intolerance   ? Prediabetes 2022  ? ? ?Past Surgical History:  ?Procedure Laterality Date  ? CHOLECYSTECTOMY  09/2012  ? NASAL SINUS SURGERY  01/2009  ? TUBAL LIGATION  10/2016  ? ? ?Current Outpatient Medications on File Prior to Visit  ?Medication Sig Dispense Refill  ? Ascorbic Acid (VITAMIN C) 1000 MG tablet Take 1,000 mg by mouth daily.    ? fluticasone (FLONASE) 50 MCG/ACT nasal spray Place 1 spray into both nostrils daily. 16 g 2  ? Multiple Vitamins-Minerals (VITAMIN D3 COMPLETE) TABS Take by mouth.    ? Semaglutide,0.25 or 0.5MG /DOS, (OZEMPIC, 0.25 OR 0.5 MG/DOSE,) 2 MG/1.5ML SOPN 0.25mg  once a week x2weeks then increase 0.5mg  weekly continuously 5 mL 0  ? Vitamin D, Ergocalciferol, (DRISDOL) 1.25 MG (50000 UNIT) CAPS capsule Take 1 capsule (50,000 Units total) by mouth every 7 (seven) days. 8 capsule 0  ? ?No current facility-administered medications on file prior to visit.  ? ? ?Allergies   ?Allergen Reactions  ? Tomato Hives  ? ? ?Social History  ? ?Socioeconomic History  ? Marital status: Married  ?  Spouse name: Jamar Dikes  ? Number of children: 2  ? Years of education: Not on file  ? Highest education level: Not on file  ?Occupational History  ? Occupation: [employer]  ?Tobacco Use  ? Smoking status: Never  ? Smokeless tobacco: Never  ?Vaping Use  ? Vaping Use: Never used  ?Substance and Sexual Activity  ? Alcohol use: Never  ? Drug use: Never  ? Sexual activity: Yes  ?  Birth control/protection: Surgical  ?  Comment: Tubal ligation  ?Other Topics Concern  ? Not on file  ?Social History Narrative  ? Not on file  ? ?Social Determinants of Health  ? ?Financial Resource Strain: Not on file  ?Food Insecurity: Not on file  ?Transportation Needs: Not on file  ?Physical Activity: Not on file  ?Stress: Not on file  ?Social Connections: Not on file  ?Intimate Partner Violence: Not on file  ? ? ?Family History  ?Problem Relation Age of Onset  ? Asthma Mother   ? Diabetes Mother   ? Depression Mother   ? Anxiety disorder Mother   ? ? ?The following portions of the patient's history were reviewed and updated as appropriate: allergies, current  medications, past family history, past medical history, past social history, past surgical history and problem list. ? ?Review of Systems ?Pertinent items noted in HPI and remainder of comprehensive ROS otherwise negative. ?  ?Objective:  ?BP 129/81   Pulse (!) 104   Ht 5\' 1"  (1.549 m)   Wt 175 lb (79.4 kg)   LMP 12/28/2021 (Exact Date)   BMI 33.07 kg/m?  ?CONSTITUTIONAL: Well-developed, well-nourished female in no acute distress.  ?HENT:  Normocephalic, atraumatic, External right and left ear normal. Oropharynx is clear and moist ?EYES: Conjunctivae and EOM are normal. No scleral icterus.  ?NECK: Normal range of motion, supple, no masses.  Mildly enlarged thyroid. No nodules ?SKIN: Skin is warm and dry. No rash noted. Not diaphoretic. No erythema. No  pallor. ?NEUROLOGIC: Alert and oriented to person, place, and time. Normal reflexes, muscle tone coordination. No cranial nerve deficit noted. ?PSYCHIATRIC: Normal mood and affect. Normal behavior. Normal judgment and thought content. ?CARDIOVASCULAR: Normal heart rate noted, regular rhythm ?RESPIRATORY: Clear to auscultation bilaterally. Effort and breath sounds normal, no problems with respiration noted. ?BREASTS: Symmetric in size. No masses, skin changes, nipple drainage, or lymphadenopathy. ?ABDOMEN: Soft, normal bowel sounds, no distention noted.  No tenderness, rebound or guarding.  ?PELVIC: Normal appearing external genitalia; normal appearing vaginal mucosa and cervix.  No abnormal discharge noted.  Pap smear obtained.  Normal uterine size, no other palpable masses, no uterine or adnexal tenderness. ?MUSCULOSKELETAL: Normal range of motion. No tenderness.  No cyanosis, clubbing, or edema.  2+ distal pulses. ? ? ?Assessment:  ?Annual gynecologic examination with pap smear ?Mildly enlarged thyroid ?Pre-diabetes ?  ?Plan:  ?Will follow up results of pap smear and manage accordingly. ?Discussed checking Thyroid panel, pt prefers to do it at her NP office ? ?Routine preventative health maintenance measures emphasized. ?Please refer to After Visit Summary for other counseling recommendations.  ? ? ? ?

## 2022-01-23 LAB — CYTOLOGY - PAP: Diagnosis: NEGATIVE

## 2022-02-27 ENCOUNTER — Ambulatory Visit: Payer: Medicaid Other | Admitting: Nurse Practitioner

## 2022-02-27 ENCOUNTER — Encounter: Payer: Self-pay | Admitting: Nurse Practitioner

## 2022-02-27 VITALS — BP 118/80 | HR 98 | Temp 97.4°F | Ht 61.0 in | Wt 175.6 lb

## 2022-02-27 DIAGNOSIS — R7303 Prediabetes: Secondary | ICD-10-CM

## 2022-02-27 DIAGNOSIS — E559 Vitamin D deficiency, unspecified: Secondary | ICD-10-CM

## 2022-02-27 DIAGNOSIS — E669 Obesity, unspecified: Secondary | ICD-10-CM

## 2022-02-27 DIAGNOSIS — E049 Nontoxic goiter, unspecified: Secondary | ICD-10-CM

## 2022-02-27 LAB — VITAMIN D 25 HYDROXY (VIT D DEFICIENCY, FRACTURES): VITD: 40.12 ng/mL (ref 30.00–100.00)

## 2022-02-27 LAB — HEMOGLOBIN A1C: Hgb A1c MFr Bld: 5.5 % (ref 4.6–6.5)

## 2022-02-27 MED ORDER — OZEMPIC (2 MG/DOSE) 8 MG/3ML ~~LOC~~ SOPN: 1.0000 mg | PEN_INJECTOR | SUBCUTANEOUS | 0 refills | Status: DC

## 2022-02-27 NOTE — Assessment & Plan Note (Signed)
On exam ?Asymptomatic ?Check thyroid panel with TSH ?

## 2022-02-27 NOTE — Assessment & Plan Note (Addendum)
Repeat vit. D: Normal vit. D: maintain 1000-2000IU daily with calcium 600mg  daily. ?

## 2022-02-27 NOTE — Assessment & Plan Note (Addendum)
Repeat hgbA1c: Improve hgbA1c: 5.8 to 5.5% ?

## 2022-02-27 NOTE — Patient Instructions (Signed)
Go to lab ?Increase ozempic to 1mg  weekly ?Add at least 125mins of moderate intensity exercise per week. ?

## 2022-02-27 NOTE — Progress Notes (Signed)
? ?             Established Patient Visit ? ?Patient: Tamara Weaver   DOB: 1991/04/13   31 y.o. Female  MRN: [id number] ?Visit Date: 02/27/2022 ? ?Subjective:  ?  ?Chief Complaint  ?Patient presents with  ? Follow-up  ?  3 month follow up on weight management and prediabetes ?  ? ?HPI ?Enlarged thyroid ?On exam ?Asymptomatic ?Check thyroid panel with TSH ? ?Obesity (BMI 30.0-34.9) ?6lbs weight loss with ozempic 0.5mg  weekly ?Denies any adverse side effects ?She continue to schedule with consistent exercise routine due to home and work responsibilities. ?She continue to maintain low fat and low carb diet. ?Wt Readings from Last 3 Encounters:  ?02/27/22 175 lb 9.6 oz (79.7 kg)  ?01/22/22 175 lb (79.4 kg)  ?12/07/21 181 lb 9.6 oz (82.4 kg)  ? ?Increase ozempic dose to 1mg  weekly ?Repeat HgbA1c ?F/up in 1-9months ?Encourage to increased weekly exercise to 1month. ? ?Prediabetes ?Repeat hgbA1c: Improve hgbA1c: 5.8 to 5.5% ? ?Vitamin D deficiency ?Repeat vit. D: Normal vit. D: maintain 1000-2000IU daily with calcium 600mg  daily. ? ?Wt Readings from Last 3 Encounters:  ?02/27/22 175 lb 9.6 oz (79.7 kg)  ?01/22/22 175 lb (79.4 kg)  ?12/07/21 181 lb 9.6 oz (82.4 kg)  ?  ?Reviewed medical, surgical, and social history today ? ?Medications: ?Outpatient Medications Prior to Visit  ?Medication Sig  ? Ascorbic Acid (VITAMIN C) 1000 MG tablet Take 1,000 mg by mouth daily.  ? cholecalciferol (VITAMIN D3) 25 MCG (1000 UNIT) tablet Take 1,000 Units by mouth daily.  ? fluticasone (FLONASE) 50 MCG/ACT nasal spray Place 1 spray into both nostrils daily.  ? Multiple Vitamins-Minerals (VITAMIN D3 COMPLETE) TABS Take by mouth.  ? VENTOLIN HFA 108 (90 Base) MCG/ACT inhaler SMARTSIG:2 Puff(s) Via Inhaler 4 Times Daily PRN  ? [DISCONTINUED] Semaglutide,0.25 or 0.5MG /DOS, (OZEMPIC, 0.25 OR 0.5 MG/DOSE,) 2 MG/1.5ML SOPN 0.25mg  once a week x2weeks then increase 0.5mg  weekly continuously  ? [DISCONTINUED] Vitamin D, Ergocalciferol, (DRISDOL) 1.25  MG (50000 UNIT) CAPS capsule Take 1 capsule (50,000 Units total) by mouth every 7 (seven) days.  ? ?No facility-administered medications prior to visit.  ? ?Reviewed past medical and social history.  ? ?ROS per HPI above ? ? ?   ?Objective:  ?BP 118/80 (BP Location: Left Arm, Patient Position: Sitting, Cuff Size: Large)   Pulse 98   Temp (!) 97.4 ?F (36.3 ?C) (Temporal)   Ht 5\' 1"  (1.549 m)   Wt 175 lb 9.6 oz (79.7 kg)   SpO2 99%   BMI 33.18 kg/m?  ? ?  ? ?Physical Exam ?Constitutional:   ?   Appearance: She is obese.  ?Neck:  ?   Thyroid: Thyromegaly present. No thyroid mass or thyroid tenderness.  ?Musculoskeletal:  ?   Cervical back: Normal range of motion and neck supple.  ?Lymphadenopathy:  ?   Cervical: No cervical adenopathy.  ?Neurological:  ?   Mental Status: She is alert and oriented to person, place, and time.  ?Psychiatric:     ?   Mood and Affect: Mood normal.     ?   Behavior: Behavior normal.     ?   Thought Content: Thought content normal.  ?  ?Results for orders placed or performed in visit on 02/27/22  ?Hemoglobin A1c  ?Result Value Ref Range  ? Hgb A1c MFr Bld 5.5 4.6 - 6.5 %  ?Vitamin D (25 hydroxy)  ?Result Value Ref Range  ? VITD 40.12 30.00 -  100.00 ng/mL  ? ?   ?Assessment & Plan:  ?  ?Problem List Items Addressed This Visit   ? ?  ? Endocrine  ? Enlarged thyroid  ?  On exam ?Asymptomatic ?Check thyroid panel with TSH ? ?  ?  ? Relevant Orders  ? Thyroid Panel With TSH  ?  ? Other  ? Obesity (BMI 30.0-34.9) - Primary  ?  6lbs weight loss with ozempic 0.5mg  weekly ?Denies any adverse side effects ?She continue to schedule with consistent exercise routine due to home and work responsibilities. ?She continue to maintain low fat and low carb diet. ?Wt Readings from Last 3 Encounters:  ?02/27/22 175 lb 9.6 oz (79.7 kg)  ?01/22/22 175 lb (79.4 kg)  ?12/07/21 181 lb 9.6 oz (82.4 kg)  ? ?Increase ozempic dose to 1mg  weekly ?Repeat HgbA1c ?F/up in 1-48months ?Encourage to increased weekly  exercise to 1month. ?  ?  ? Relevant Medications  ? Semaglutide, 2 MG/DOSE, (OZEMPIC, 2 MG/DOSE,) 8 MG/3ML SOPN  ? Prediabetes  ?  Repeat hgbA1c: Improve hgbA1c: 5.8 to 5.5% ?  ?  ? Relevant Orders  ? Hemoglobin A1c (Completed)  ? Vitamin D deficiency  ?  Repeat vit. D: Normal vit. D: maintain 1000-2000IU daily with calcium 600mg  daily. ?  ?  ? Relevant Orders  ? Vitamin D (25 hydroxy) (Completed)  ? ?Return in about 3 months (around 05/30/2022) for weight management and prediabetes. ? ?  ? ? , NP ? ? ?

## 2022-02-27 NOTE — Assessment & Plan Note (Addendum)
6lbs weight loss with ozempic 0.5mg  weekly ?Denies any adverse side effects ?She continue to schedule with consistent exercise routine due to home and work responsibilities. ?She continue to maintain low fat and low carb diet. ?Wt Readings from Last 3 Encounters:  ?02/27/22 175 lb 9.6 oz (79.7 kg)  ?01/22/22 175 lb (79.4 kg)  ?12/07/21 181 lb 9.6 oz (82.4 kg)  ? ?Increase ozempic dose to 1mg  weekly ?Repeat HgbA1c ?F/up in 1-70months ?Encourage to increased weekly exercise to 143mins. ?

## 2022-02-28 LAB — THYROID PANEL WITH TSH
Free Thyroxine Index: 2.5 (ref 1.4–3.8)
T3 Uptake: 28 % (ref 22–35)
T4, Total: 9 ug/dL (ref 5.1–11.9)
TSH: 1.66 mIU/L

## 2022-03-14 ENCOUNTER — Telehealth: Payer: Self-pay | Admitting: Nurse Practitioner

## 2022-03-14 DIAGNOSIS — E669 Obesity, unspecified: Secondary | ICD-10-CM

## 2022-03-14 DIAGNOSIS — E66811 Obesity, class 1: Secondary | ICD-10-CM

## 2022-03-14 NOTE — Telephone Encounter (Signed)
The pharmacy told pt that the wrong pen for her ozempic was scribed. Please change.

## 2022-03-15 MED ORDER — BD PEN NEEDLE NANO U/F 32G X 4 MM MISC: 1.0000 [IU] | 2 refills | Status: AC

## 2022-05-29 ENCOUNTER — Encounter (INDEPENDENT_AMBULATORY_CARE_PROVIDER_SITE_OTHER): Payer: Self-pay

## 2022-05-31 ENCOUNTER — Ambulatory Visit: Payer: Medicaid Other | Admitting: Nurse Practitioner

## 2022-06-07 ENCOUNTER — Encounter: Payer: Self-pay | Admitting: Nurse Practitioner

## 2022-06-07 ENCOUNTER — Ambulatory Visit: Payer: Medicaid Other | Admitting: Nurse Practitioner

## 2022-06-07 VITALS — BP 108/70 | HR 108 | Temp 97.1°F | Ht 61.0 in | Wt 157.8 lb

## 2022-06-07 DIAGNOSIS — E049 Nontoxic goiter, unspecified: Secondary | ICD-10-CM | POA: Diagnosis not present

## 2022-06-07 DIAGNOSIS — E669 Obesity, unspecified: Secondary | ICD-10-CM | POA: Diagnosis not present

## 2022-06-07 LAB — COMPREHENSIVE METABOLIC PANEL
ALT: 33 U/L (ref 0–35)
AST: 20 U/L (ref 0–37)
Albumin: 4.8 g/dL (ref 3.5–5.2)
Alkaline Phosphatase: 104 U/L (ref 39–117)
BUN: 12 mg/dL (ref 6–23)
CO2: 27 mEq/L (ref 19–32)
Calcium: 10 mg/dL (ref 8.4–10.5)
Chloride: 102 mEq/L (ref 96–112)
Creatinine, Ser: 0.92 mg/dL (ref 0.40–1.20)
GFR: 84.02 mL/min (ref >60.00)
Glucose, Bld: 82 mg/dL (ref 70–99)
Potassium: 4.3 mEq/L (ref 3.5–5.1)
Sodium: 137 mEq/L (ref 135–145)
Total Bilirubin: 0.4 mg/dL (ref 0.2–1.2)
Total Protein: 8.2 g/dL (ref 6.0–8.3)

## 2022-06-07 LAB — TSH: TSH: 1.24 u[IU]/mL (ref 0.35–5.50)

## 2022-06-07 MED ORDER — OZEMPIC (2 MG/DOSE) 8 MG/3ML ~~LOC~~ SOPN: 1.0000 mg | PEN_INJECTOR | SUBCUTANEOUS | 0 refills | Status: DC

## 2022-06-07 NOTE — Patient Instructions (Signed)
Maintain med dose Incorporate weight training exercise at home Continue of moderate intensity cardio exercise per week

## 2022-06-07 NOTE — Assessment & Plan Note (Addendum)
Lost additional 18lbs in last 84months No adverse effects with ozempic 2mg  Has maintain heart healthy diet and small meal portions She continues to struggle with maintain consistent exercise regimen. Wt Readings from Last 3 Encounters:  06/07/22 157 lb 12.8 oz (71.6 kg)  02/27/22 175 lb 9.6 oz (79.7 kg)  01/22/22 175 lb (79.4 kg)   check CMP: normal We discussed ways to incorporate cardio and weight training exercise at home. Maintain ozempic dose F/up in 1-64months

## 2022-06-07 NOTE — Progress Notes (Signed)
Established Patient Visit  Patient: Tamara Weaver   DOB: October 16, 1991   30 y.o. Female  MRN: [id number] Visit Date: 06/07/2022  Subjective:    Chief Complaint  Patient presents with   Follow-up    3 month follow up on Ozempic, no concerns. Patient fasting.    HPI Enlarged thyroid No dysphagia or hoarseness or sorethroat. Repeat TSH today: normal Repeat annually  Obesity (BMI 30.0-34.9) Lost additional 18lbs in last 43months No adverse effects with ozempic 2mg  Has maintain heart healthy diet and small meal portions She continues to struggle with maintain consistent exercise regimen. Wt Readings from Last 3 Encounters:  06/07/22 157 lb 12.8 oz (71.6 kg)  02/27/22 175 lb 9.6 oz (79.7 kg)  01/22/22 175 lb (79.4 kg)   check CMP: normal We discussed ways to incorporate cardio and weight training exercise at home. Maintain ozempic dose F/up in 1-69months  Reviewed medical, surgical, and social history today  Medications: Outpatient Medications Prior to Visit  Medication Sig   Ascorbic Acid (VITAMIN C) 1000 MG tablet Take 1,000 mg by mouth daily.   cholecalciferol (VITAMIN D3) 25 MCG (1000 UNIT) tablet Take 1,000 Units by mouth daily.   fluticasone (FLONASE) 50 MCG/ACT nasal spray Place 1 spray into both nostrils daily.   Multiple Vitamins-Minerals (VITAMIN D3 COMPLETE) TABS Take by mouth.   Semaglutide, 2 MG/DOSE, (OZEMPIC, 2 MG/DOSE,) 8 MG/3ML SOPN Inject 1 mg into the skin once a week.   VENTOLIN HFA 108 (90 Base) MCG/ACT inhaler SMARTSIG:2 Puff(s) Via Inhaler 4 Times Daily PRN   Insulin Pen Needle (BD PEN NEEDLE NANO U/F) 32G X 4 MM MISC 1 Units by Does not apply route once a week. (Patient not taking: Reported on 06/07/2022)   No facility-administered medications prior to visit.   Reviewed past medical and social history.   ROS per HPI above      Objective:  BP 108/70 (BP Location: Left Arm, Patient Position: Sitting, Cuff Size: Large)   Pulse (!) 108    Temp (!) 97.1 F (36.2 C) (Temporal)   Ht 5\' 1"  (1.549 m)   Wt 157 lb 12.8 oz (71.6 kg)   SpO2 99%   BMI 29.82 kg/m      Physical Exam Cardiovascular:     Rate and Rhythm: Normal rate and regular rhythm.     Pulses: Normal pulses.     Heart sounds: Normal heart sounds.  Pulmonary:     Effort: Pulmonary effort is normal.     Breath sounds: Normal breath sounds.  Musculoskeletal:     Cervical back: Normal range of motion and neck supple. No tenderness.  Lymphadenopathy:     Cervical: No cervical adenopathy.  Neurological:     Mental Status: She is oriented to person, place, and time.  Psychiatric:        Mood and Affect: Mood normal.        Behavior: Behavior normal.        Thought Content: Thought content normal.     Results for orders placed or performed in visit on 06/07/22  Comprehensive metabolic panel  Result Value Ref Range   Sodium 137 135 - 145 mEq/L   Potassium 4.3 3.5 - 5.1 mEq/L   Chloride 102 96 - 112 mEq/L   CO2 27 19 - 32 mEq/L   Glucose, Bld 82 70 - 99 mg/dL   BUN 12 6 - 23 mg/dL   Creatinine,  Ser 0.92 0.40 - 1.20 mg/dL   Total Bilirubin 0.4 0.2 - 1.2 mg/dL   Alkaline Phosphatase 104 39 - 117 U/L   AST 20 0 - 37 U/L   ALT 33 0 - 35 U/L   Total Protein 8.2 6.0 - 8.3 g/dL   Albumin 4.8 3.5 - 5.2 g/dL   GFR 97.67 >34.19 mL/min   Calcium 10.0 8.4 - 10.5 mg/dL  TSH  Result Value Ref Range   TSH 1.24 0.35 - 5.50 uIU/mL      Assessment & Plan:    Problem List Items Addressed This Visit       Endocrine   Enlarged thyroid    No dysphagia or hoarseness or sorethroat. Repeat TSH today: normal Repeat annually      Relevant Orders   TSH (Completed)     Other   Obesity (BMI 30.0-34.9) - Primary    Lost additional 18lbs in last 62months No adverse effects with ozempic 2mg  Has maintain heart healthy diet and small meal portions She continues to struggle with maintain consistent exercise regimen. Wt Readings from Last 3 Encounters:  06/07/22 157  lb 12.8 oz (71.6 kg)  02/27/22 175 lb 9.6 oz (79.7 kg)  01/22/22 175 lb (79.4 kg)   check CMP: normal We discussed ways to incorporate cardio and weight training exercise at home. Maintain ozempic dose F/up in 1-61months       Relevant Orders   Comprehensive metabolic panel (Completed)   Return in about 3 months (around 09/07/2022) for Weight management.     09/09/2022, NP

## 2022-06-07 NOTE — Assessment & Plan Note (Addendum)
No dysphagia or hoarseness or sorethroat. Repeat TSH today: normal Repeat annually

## 2022-06-26 ENCOUNTER — Encounter: Payer: Self-pay | Admitting: Nurse Practitioner

## 2022-06-26 DIAGNOSIS — E669 Obesity, unspecified: Secondary | ICD-10-CM

## 2022-06-26 DIAGNOSIS — R7303 Prediabetes: Secondary | ICD-10-CM

## 2022-06-27 MED ORDER — OZEMPIC (1 MG/DOSE) 4 MG/3ML ~~LOC~~ SOPN: 1.0000 mg | PEN_INJECTOR | SUBCUTANEOUS | 0 refills | Status: AC

## 2023-05-21 DIAGNOSIS — R0982 Postnasal drip: Secondary | ICD-10-CM | POA: Diagnosis not present

## 2023-05-21 DIAGNOSIS — R070 Pain in throat: Secondary | ICD-10-CM | POA: Diagnosis not present

## 2023-05-21 DIAGNOSIS — J029 Acute pharyngitis, unspecified: Secondary | ICD-10-CM | POA: Diagnosis not present

## 2024-01-21 ENCOUNTER — Telehealth: Payer: Self-pay | Admitting: Nurse Practitioner

## 2024-01-21 NOTE — Telephone Encounter (Signed)
 [id number] to schedule appt if needed.
